# Patient Record
Sex: Female | Born: 1975 | Race: Black or African American | Hispanic: No | Marital: Single | Smoking: Never smoker
Health system: Southern US, Community
[De-identification: ages and names within clinical notes are randomized; demographics above are authoritative.]

## PROBLEM LIST (undated history)

## (undated) DIAGNOSIS — M329 Systemic lupus erythematosus, unspecified: Secondary | ICD-10-CM

## (undated) DIAGNOSIS — D219 Benign neoplasm of connective and other soft tissue, unspecified: Secondary | ICD-10-CM

## (undated) DIAGNOSIS — J40 Bronchitis, not specified as acute or chronic: Secondary | ICD-10-CM

## (undated) DIAGNOSIS — J45909 Unspecified asthma, uncomplicated: Secondary | ICD-10-CM

## (undated) DIAGNOSIS — F419 Anxiety disorder, unspecified: Secondary | ICD-10-CM

## (undated) DIAGNOSIS — K602 Anal fissure, unspecified: Secondary | ICD-10-CM

## (undated) DIAGNOSIS — N83209 Unspecified ovarian cyst, unspecified side: Secondary | ICD-10-CM

## (undated) DIAGNOSIS — I10 Essential (primary) hypertension: Secondary | ICD-10-CM

## (undated) DIAGNOSIS — R51 Headache: Secondary | ICD-10-CM

## (undated) DIAGNOSIS — G8929 Other chronic pain: Secondary | ICD-10-CM

## (undated) DIAGNOSIS — E669 Obesity, unspecified: Secondary | ICD-10-CM

## (undated) DIAGNOSIS — IMO0002 Reserved for concepts with insufficient information to code with codable children: Secondary | ICD-10-CM

## (undated) DIAGNOSIS — E119 Type 2 diabetes mellitus without complications: Secondary | ICD-10-CM

## (undated) DIAGNOSIS — T4145XA Adverse effect of unspecified anesthetic, initial encounter: Secondary | ICD-10-CM

## (undated) DIAGNOSIS — M549 Dorsalgia, unspecified: Secondary | ICD-10-CM

## (undated) DIAGNOSIS — M542 Cervicalgia: Secondary | ICD-10-CM

## (undated) DIAGNOSIS — R519 Headache, unspecified: Secondary | ICD-10-CM

## (undated) DIAGNOSIS — T8859XA Other complications of anesthesia, initial encounter: Secondary | ICD-10-CM

## (undated) HISTORY — DX: Anxiety disorder, unspecified: F41.9

## (undated) HISTORY — DX: Headache: R51

## (undated) HISTORY — PX: APPENDECTOMY: SHX54

## (undated) HISTORY — DX: Anal fissure, unspecified: K60.2

## (undated) HISTORY — PX: WISDOM TOOTH EXTRACTION: SHX21

## (undated) HISTORY — PX: OVARIAN CYST REMOVAL: SHX89

## (undated) HISTORY — DX: Bronchitis, not specified as acute or chronic: J40

## (undated) HISTORY — DX: Dorsalgia, unspecified: M54.9

## (undated) HISTORY — DX: Systemic lupus erythematosus, unspecified: M32.9

## (undated) HISTORY — PX: BREAST LUMPECTOMY: SHX2

## (undated) HISTORY — PX: DILATION AND CURETTAGE OF UTERUS: SHX78

## (undated) HISTORY — DX: Obesity, unspecified: E66.9

## (undated) HISTORY — DX: Reserved for concepts with insufficient information to code with codable children: IMO0002

## (undated) HISTORY — DX: Cervicalgia: M54.2

## (undated) HISTORY — DX: Headache, unspecified: R51.9

## (undated) HISTORY — DX: Other chronic pain: G89.29

## (undated) HISTORY — DX: Type 2 diabetes mellitus without complications: E11.9

---

## 1998-08-06 ENCOUNTER — Emergency Department (HOSPITAL_COMMUNITY): Admission: EM | Admit: 1998-08-06 | Discharge: 1998-08-06 | Payer: Self-pay | Admitting: Emergency Medicine

## 1998-08-08 ENCOUNTER — Emergency Department (HOSPITAL_COMMUNITY): Admission: EM | Admit: 1998-08-08 | Discharge: 1998-08-09 | Payer: Self-pay | Admitting: Emergency Medicine

## 1998-08-10 ENCOUNTER — Ambulatory Visit (HOSPITAL_COMMUNITY): Admission: RE | Admit: 1998-08-10 | Discharge: 1998-08-10 | Payer: Self-pay | Admitting: Family Medicine

## 1998-08-10 ENCOUNTER — Encounter: Payer: Self-pay | Admitting: Family Medicine

## 1998-09-18 ENCOUNTER — Emergency Department (HOSPITAL_COMMUNITY): Admission: EM | Admit: 1998-09-18 | Discharge: 1998-09-18 | Payer: Self-pay | Admitting: Emergency Medicine

## 1998-10-17 ENCOUNTER — Emergency Department (HOSPITAL_COMMUNITY): Admission: EM | Admit: 1998-10-17 | Discharge: 1998-10-17 | Payer: Self-pay | Admitting: *Deleted

## 1998-10-31 ENCOUNTER — Emergency Department (HOSPITAL_COMMUNITY): Admission: EM | Admit: 1998-10-31 | Discharge: 1998-10-31 | Payer: Self-pay | Admitting: Emergency Medicine

## 1998-11-03 ENCOUNTER — Emergency Department (HOSPITAL_COMMUNITY): Admission: EM | Admit: 1998-11-03 | Discharge: 1998-11-03 | Payer: Self-pay | Admitting: Emergency Medicine

## 1998-11-11 ENCOUNTER — Emergency Department (HOSPITAL_COMMUNITY): Admission: EM | Admit: 1998-11-11 | Discharge: 1998-11-11 | Payer: Self-pay | Admitting: Emergency Medicine

## 1998-11-11 ENCOUNTER — Encounter: Payer: Self-pay | Admitting: Emergency Medicine

## 1998-11-27 ENCOUNTER — Emergency Department (HOSPITAL_COMMUNITY): Admission: EM | Admit: 1998-11-27 | Discharge: 1998-11-27 | Payer: Self-pay | Admitting: Emergency Medicine

## 1998-11-30 ENCOUNTER — Emergency Department (HOSPITAL_COMMUNITY): Admission: EM | Admit: 1998-11-30 | Discharge: 1998-11-30 | Payer: Self-pay | Admitting: Emergency Medicine

## 1998-12-01 ENCOUNTER — Ambulatory Visit (HOSPITAL_COMMUNITY): Admission: RE | Admit: 1998-12-01 | Discharge: 1998-12-01 | Payer: Self-pay | Admitting: Emergency Medicine

## 1998-12-01 ENCOUNTER — Encounter: Payer: Self-pay | Admitting: Emergency Medicine

## 1998-12-02 ENCOUNTER — Emergency Department (HOSPITAL_COMMUNITY): Admission: EM | Admit: 1998-12-02 | Discharge: 1998-12-03 | Payer: Self-pay | Admitting: Emergency Medicine

## 1998-12-02 ENCOUNTER — Emergency Department (HOSPITAL_COMMUNITY): Admission: EM | Admit: 1998-12-02 | Discharge: 1998-12-02 | Payer: Self-pay | Admitting: Internal Medicine

## 1998-12-10 ENCOUNTER — Inpatient Hospital Stay (HOSPITAL_COMMUNITY): Admission: AD | Admit: 1998-12-10 | Discharge: 1998-12-10 | Payer: Self-pay | Admitting: Family Medicine

## 1998-12-12 ENCOUNTER — Inpatient Hospital Stay (HOSPITAL_COMMUNITY): Admission: AD | Admit: 1998-12-12 | Discharge: 1998-12-12 | Payer: Self-pay | Admitting: *Deleted

## 1998-12-14 ENCOUNTER — Inpatient Hospital Stay (HOSPITAL_COMMUNITY): Admission: AD | Admit: 1998-12-14 | Discharge: 1998-12-14 | Payer: Self-pay | Admitting: *Deleted

## 1998-12-18 ENCOUNTER — Inpatient Hospital Stay (HOSPITAL_COMMUNITY): Admission: AD | Admit: 1998-12-18 | Discharge: 1998-12-18 | Payer: Self-pay | Admitting: Obstetrics

## 1998-12-25 ENCOUNTER — Inpatient Hospital Stay (HOSPITAL_COMMUNITY): Admission: AD | Admit: 1998-12-25 | Discharge: 1998-12-25 | Payer: Self-pay | Admitting: *Deleted

## 1998-12-31 ENCOUNTER — Emergency Department (HOSPITAL_COMMUNITY): Admission: EM | Admit: 1998-12-31 | Discharge: 1998-12-31 | Payer: Self-pay | Admitting: Emergency Medicine

## 1999-01-01 ENCOUNTER — Encounter: Payer: Self-pay | Admitting: Emergency Medicine

## 1999-01-13 ENCOUNTER — Emergency Department (HOSPITAL_COMMUNITY): Admission: EM | Admit: 1999-01-13 | Discharge: 1999-01-13 | Payer: Self-pay | Admitting: Emergency Medicine

## 1999-02-07 ENCOUNTER — Inpatient Hospital Stay (HOSPITAL_COMMUNITY): Admission: AD | Admit: 1999-02-07 | Discharge: 1999-02-07 | Payer: Self-pay | Admitting: Obstetrics & Gynecology

## 1999-02-09 ENCOUNTER — Emergency Department (HOSPITAL_COMMUNITY): Admission: EM | Admit: 1999-02-09 | Discharge: 1999-02-09 | Payer: Self-pay | Admitting: Emergency Medicine

## 1999-02-11 ENCOUNTER — Encounter: Payer: Self-pay | Admitting: Emergency Medicine

## 1999-02-11 ENCOUNTER — Ambulatory Visit (HOSPITAL_COMMUNITY): Admission: RE | Admit: 1999-02-11 | Discharge: 1999-02-11 | Payer: Self-pay | Admitting: Emergency Medicine

## 1999-02-16 ENCOUNTER — Inpatient Hospital Stay (HOSPITAL_COMMUNITY): Admission: AD | Admit: 1999-02-16 | Discharge: 1999-02-16 | Payer: Self-pay | Admitting: *Deleted

## 1999-02-19 ENCOUNTER — Inpatient Hospital Stay (HOSPITAL_COMMUNITY): Admission: AD | Admit: 1999-02-19 | Discharge: 1999-02-19 | Payer: Self-pay | Admitting: Obstetrics

## 1999-02-23 ENCOUNTER — Encounter: Payer: Self-pay | Admitting: Emergency Medicine

## 1999-02-23 ENCOUNTER — Emergency Department (HOSPITAL_COMMUNITY): Admission: EM | Admit: 1999-02-23 | Discharge: 1999-02-24 | Payer: Self-pay | Admitting: Emergency Medicine

## 1999-03-27 ENCOUNTER — Inpatient Hospital Stay (HOSPITAL_COMMUNITY): Admission: AD | Admit: 1999-03-27 | Discharge: 1999-03-27 | Payer: Self-pay | Admitting: *Deleted

## 1999-04-09 ENCOUNTER — Emergency Department (HOSPITAL_COMMUNITY): Admission: EM | Admit: 1999-04-09 | Discharge: 1999-04-09 | Payer: Self-pay | Admitting: Emergency Medicine

## 1999-04-15 ENCOUNTER — Inpatient Hospital Stay (HOSPITAL_COMMUNITY): Admission: AD | Admit: 1999-04-15 | Discharge: 1999-04-15 | Payer: Self-pay | Admitting: Obstetrics & Gynecology

## 1999-04-20 ENCOUNTER — Emergency Department (HOSPITAL_COMMUNITY): Admission: EM | Admit: 1999-04-20 | Discharge: 1999-04-20 | Payer: Self-pay | Admitting: Emergency Medicine

## 1999-04-20 ENCOUNTER — Encounter: Payer: Self-pay | Admitting: *Deleted

## 1999-04-22 ENCOUNTER — Emergency Department (HOSPITAL_COMMUNITY): Admission: EM | Admit: 1999-04-22 | Discharge: 1999-04-22 | Payer: Self-pay | Admitting: Emergency Medicine

## 1999-04-25 ENCOUNTER — Emergency Department (HOSPITAL_COMMUNITY): Admission: EM | Admit: 1999-04-25 | Discharge: 1999-04-26 | Payer: Self-pay | Admitting: Emergency Medicine

## 1999-05-13 ENCOUNTER — Encounter: Admission: RE | Admit: 1999-05-13 | Discharge: 1999-05-13 | Payer: Self-pay | Admitting: Obstetrics

## 1999-06-09 ENCOUNTER — Emergency Department (HOSPITAL_COMMUNITY): Admission: EM | Admit: 1999-06-09 | Discharge: 1999-06-09 | Payer: Self-pay | Admitting: Emergency Medicine

## 1999-06-09 ENCOUNTER — Encounter: Payer: Self-pay | Admitting: Emergency Medicine

## 1999-11-12 ENCOUNTER — Inpatient Hospital Stay (HOSPITAL_COMMUNITY): Admission: AD | Admit: 1999-11-12 | Discharge: 1999-11-12 | Payer: Self-pay | Admitting: Obstetrics & Gynecology

## 1999-11-16 ENCOUNTER — Inpatient Hospital Stay (HOSPITAL_COMMUNITY): Admission: EM | Admit: 1999-11-16 | Discharge: 1999-11-16 | Payer: Self-pay | Admitting: *Deleted

## 2000-02-10 ENCOUNTER — Emergency Department (HOSPITAL_COMMUNITY): Admission: EM | Admit: 2000-02-10 | Discharge: 2000-02-10 | Payer: Self-pay | Admitting: *Deleted

## 2000-03-14 ENCOUNTER — Inpatient Hospital Stay (HOSPITAL_COMMUNITY): Admission: AD | Admit: 2000-03-14 | Discharge: 2000-03-14 | Payer: Self-pay | Admitting: Obstetrics

## 2000-04-28 ENCOUNTER — Emergency Department (HOSPITAL_COMMUNITY): Admission: EM | Admit: 2000-04-28 | Discharge: 2000-04-28 | Payer: Self-pay | Admitting: Emergency Medicine

## 2000-07-02 ENCOUNTER — Inpatient Hospital Stay (HOSPITAL_COMMUNITY): Admission: AD | Admit: 2000-07-02 | Discharge: 2000-07-02 | Payer: Self-pay | Admitting: Obstetrics

## 2000-07-18 ENCOUNTER — Emergency Department (HOSPITAL_COMMUNITY): Admission: EM | Admit: 2000-07-18 | Discharge: 2000-07-18 | Payer: Self-pay | Admitting: Internal Medicine

## 2000-08-12 ENCOUNTER — Inpatient Hospital Stay (HOSPITAL_COMMUNITY): Admission: AD | Admit: 2000-08-12 | Discharge: 2000-08-12 | Payer: Self-pay | Admitting: *Deleted

## 2000-10-21 ENCOUNTER — Emergency Department (HOSPITAL_COMMUNITY): Admission: EM | Admit: 2000-10-21 | Discharge: 2000-10-21 | Payer: Self-pay | Admitting: Emergency Medicine

## 2000-11-02 ENCOUNTER — Inpatient Hospital Stay (HOSPITAL_COMMUNITY): Admission: AD | Admit: 2000-11-02 | Discharge: 2000-11-02 | Payer: Self-pay | Admitting: Obstetrics & Gynecology

## 2000-11-20 ENCOUNTER — Emergency Department (HOSPITAL_COMMUNITY): Admission: EM | Admit: 2000-11-20 | Discharge: 2000-11-20 | Payer: Self-pay | Admitting: Emergency Medicine

## 2001-04-08 ENCOUNTER — Inpatient Hospital Stay (HOSPITAL_COMMUNITY): Admission: AD | Admit: 2001-04-08 | Discharge: 2001-04-08 | Payer: Self-pay | Admitting: Obstetrics & Gynecology

## 2001-07-12 ENCOUNTER — Inpatient Hospital Stay (HOSPITAL_COMMUNITY): Admission: AD | Admit: 2001-07-12 | Discharge: 2001-07-12 | Payer: Self-pay | Admitting: Obstetrics & Gynecology

## 2001-09-06 ENCOUNTER — Encounter: Admission: RE | Admit: 2001-09-06 | Discharge: 2001-09-06 | Payer: Self-pay | Admitting: Obstetrics

## 2001-09-25 ENCOUNTER — Ambulatory Visit (HOSPITAL_COMMUNITY): Admission: RE | Admit: 2001-09-25 | Discharge: 2001-09-25 | Payer: Self-pay | Admitting: Internal Medicine

## 2002-07-12 ENCOUNTER — Emergency Department (HOSPITAL_COMMUNITY): Admission: EM | Admit: 2002-07-12 | Discharge: 2002-07-12 | Payer: Self-pay | Admitting: Emergency Medicine

## 2002-09-15 ENCOUNTER — Emergency Department (HOSPITAL_COMMUNITY): Admission: EM | Admit: 2002-09-15 | Discharge: 2002-09-15 | Payer: Self-pay | Admitting: Emergency Medicine

## 2003-01-23 ENCOUNTER — Encounter: Payer: Self-pay | Admitting: Emergency Medicine

## 2003-01-23 ENCOUNTER — Emergency Department (HOSPITAL_COMMUNITY): Admission: EM | Admit: 2003-01-23 | Discharge: 2003-01-23 | Payer: Self-pay | Admitting: Emergency Medicine

## 2003-08-16 ENCOUNTER — Emergency Department (HOSPITAL_COMMUNITY): Admission: EM | Admit: 2003-08-16 | Discharge: 2003-08-16 | Payer: Self-pay | Admitting: Emergency Medicine

## 2004-01-07 ENCOUNTER — Inpatient Hospital Stay (HOSPITAL_COMMUNITY): Admission: AD | Admit: 2004-01-07 | Discharge: 2004-01-08 | Payer: Self-pay | Admitting: Obstetrics and Gynecology

## 2004-03-29 ENCOUNTER — Emergency Department (HOSPITAL_COMMUNITY): Admission: EM | Admit: 2004-03-29 | Discharge: 2004-03-29 | Payer: Self-pay | Admitting: Emergency Medicine

## 2004-06-28 ENCOUNTER — Emergency Department (HOSPITAL_COMMUNITY): Admission: EM | Admit: 2004-06-28 | Discharge: 2004-06-28 | Payer: Self-pay | Admitting: Emergency Medicine

## 2004-08-11 ENCOUNTER — Emergency Department (HOSPITAL_COMMUNITY): Admission: EM | Admit: 2004-08-11 | Discharge: 2004-08-11 | Payer: Self-pay | Admitting: Emergency Medicine

## 2004-11-25 ENCOUNTER — Emergency Department (HOSPITAL_COMMUNITY): Admission: EM | Admit: 2004-11-25 | Discharge: 2004-11-25 | Payer: Self-pay | Admitting: Emergency Medicine

## 2004-12-05 ENCOUNTER — Inpatient Hospital Stay (HOSPITAL_COMMUNITY): Admission: AD | Admit: 2004-12-05 | Discharge: 2004-12-05 | Payer: Self-pay | Admitting: *Deleted

## 2004-12-13 ENCOUNTER — Inpatient Hospital Stay (HOSPITAL_COMMUNITY): Admission: RE | Admit: 2004-12-13 | Discharge: 2004-12-13 | Payer: Self-pay | Admitting: *Deleted

## 2004-12-16 ENCOUNTER — Inpatient Hospital Stay (HOSPITAL_COMMUNITY): Admission: AD | Admit: 2004-12-16 | Discharge: 2004-12-16 | Payer: Self-pay | Admitting: *Deleted

## 2004-12-29 ENCOUNTER — Emergency Department (HOSPITAL_COMMUNITY): Admission: EM | Admit: 2004-12-29 | Discharge: 2004-12-29 | Payer: Self-pay | Admitting: Emergency Medicine

## 2005-01-18 ENCOUNTER — Inpatient Hospital Stay (HOSPITAL_COMMUNITY): Admission: AD | Admit: 2005-01-18 | Discharge: 2005-01-18 | Payer: Self-pay | Admitting: Obstetrics & Gynecology

## 2005-01-28 ENCOUNTER — Inpatient Hospital Stay (HOSPITAL_COMMUNITY): Admission: AD | Admit: 2005-01-28 | Discharge: 2005-01-28 | Payer: Self-pay | Admitting: Obstetrics & Gynecology

## 2005-02-05 ENCOUNTER — Observation Stay (HOSPITAL_COMMUNITY): Admission: AD | Admit: 2005-02-05 | Discharge: 2005-02-05 | Payer: Self-pay | Admitting: Obstetrics and Gynecology

## 2005-02-09 ENCOUNTER — Ambulatory Visit (HOSPITAL_COMMUNITY): Admission: RE | Admit: 2005-02-09 | Discharge: 2005-02-09 | Payer: Self-pay | Admitting: Obstetrics and Gynecology

## 2005-02-23 ENCOUNTER — Inpatient Hospital Stay (HOSPITAL_COMMUNITY): Admission: AD | Admit: 2005-02-23 | Discharge: 2005-02-23 | Payer: Self-pay | Admitting: Obstetrics and Gynecology

## 2005-04-23 ENCOUNTER — Emergency Department (HOSPITAL_COMMUNITY): Admission: EM | Admit: 2005-04-23 | Discharge: 2005-04-23 | Payer: Self-pay | Admitting: Emergency Medicine

## 2005-05-11 ENCOUNTER — Inpatient Hospital Stay (HOSPITAL_COMMUNITY): Admission: AD | Admit: 2005-05-11 | Discharge: 2005-05-11 | Payer: Self-pay | Admitting: Obstetrics & Gynecology

## 2005-06-04 ENCOUNTER — Inpatient Hospital Stay (HOSPITAL_COMMUNITY): Admission: AD | Admit: 2005-06-04 | Discharge: 2005-06-04 | Payer: Self-pay | Admitting: Obstetrics and Gynecology

## 2005-07-10 ENCOUNTER — Inpatient Hospital Stay (HOSPITAL_COMMUNITY): Admission: AD | Admit: 2005-07-10 | Discharge: 2005-07-10 | Payer: Self-pay | Admitting: Obstetrics and Gynecology

## 2005-07-17 ENCOUNTER — Inpatient Hospital Stay (HOSPITAL_COMMUNITY): Admission: AD | Admit: 2005-07-17 | Discharge: 2005-07-18 | Payer: Self-pay | Admitting: Obstetrics & Gynecology

## 2005-07-19 ENCOUNTER — Inpatient Hospital Stay (HOSPITAL_COMMUNITY): Admission: AD | Admit: 2005-07-19 | Discharge: 2005-07-19 | Payer: Self-pay | Admitting: Obstetrics & Gynecology

## 2005-07-24 ENCOUNTER — Inpatient Hospital Stay (HOSPITAL_COMMUNITY): Admission: AD | Admit: 2005-07-24 | Discharge: 2005-07-24 | Payer: Self-pay | Admitting: Obstetrics and Gynecology

## 2005-07-25 ENCOUNTER — Inpatient Hospital Stay (HOSPITAL_COMMUNITY): Admission: AD | Admit: 2005-07-25 | Discharge: 2005-07-25 | Payer: Self-pay | Admitting: Obstetrics and Gynecology

## 2005-07-26 ENCOUNTER — Inpatient Hospital Stay (HOSPITAL_COMMUNITY): Admission: AD | Admit: 2005-07-26 | Discharge: 2005-07-28 | Payer: Self-pay | Admitting: Obstetrics and Gynecology

## 2005-11-20 ENCOUNTER — Emergency Department (HOSPITAL_COMMUNITY): Admission: EM | Admit: 2005-11-20 | Discharge: 2005-11-20 | Payer: Self-pay | Admitting: Emergency Medicine

## 2006-01-15 ENCOUNTER — Emergency Department (HOSPITAL_COMMUNITY): Admission: EM | Admit: 2006-01-15 | Discharge: 2006-01-15 | Payer: Self-pay | Admitting: *Deleted

## 2006-02-12 ENCOUNTER — Emergency Department (HOSPITAL_COMMUNITY): Admission: EM | Admit: 2006-02-12 | Discharge: 2006-02-12 | Payer: Self-pay | Admitting: Emergency Medicine

## 2006-08-08 ENCOUNTER — Emergency Department (HOSPITAL_COMMUNITY): Admission: EM | Admit: 2006-08-08 | Discharge: 2006-08-09 | Payer: Self-pay | Admitting: Emergency Medicine

## 2006-09-08 ENCOUNTER — Emergency Department (HOSPITAL_COMMUNITY): Admission: EM | Admit: 2006-09-08 | Discharge: 2006-09-08 | Payer: Self-pay | Admitting: Emergency Medicine

## 2006-10-09 ENCOUNTER — Ambulatory Visit (HOSPITAL_COMMUNITY): Admission: EM | Admit: 2006-10-09 | Discharge: 2006-10-10 | Payer: Self-pay | Admitting: Emergency Medicine

## 2006-10-10 ENCOUNTER — Encounter (INDEPENDENT_AMBULATORY_CARE_PROVIDER_SITE_OTHER): Payer: Self-pay | Admitting: *Deleted

## 2006-12-13 ENCOUNTER — Encounter (INDEPENDENT_AMBULATORY_CARE_PROVIDER_SITE_OTHER): Payer: Self-pay | Admitting: *Deleted

## 2006-12-13 ENCOUNTER — Ambulatory Visit (HOSPITAL_COMMUNITY): Admission: RE | Admit: 2006-12-13 | Discharge: 2006-12-13 | Payer: Self-pay | Admitting: Obstetrics and Gynecology

## 2007-07-13 ENCOUNTER — Emergency Department (HOSPITAL_COMMUNITY): Admission: EM | Admit: 2007-07-13 | Discharge: 2007-07-13 | Payer: Self-pay | Admitting: Emergency Medicine

## 2007-08-05 ENCOUNTER — Inpatient Hospital Stay (HOSPITAL_COMMUNITY): Admission: AD | Admit: 2007-08-05 | Discharge: 2007-08-05 | Payer: Self-pay | Admitting: Obstetrics & Gynecology

## 2008-04-21 ENCOUNTER — Emergency Department (HOSPITAL_COMMUNITY): Admission: EM | Admit: 2008-04-21 | Discharge: 2008-04-22 | Payer: Self-pay | Admitting: Emergency Medicine

## 2008-07-01 ENCOUNTER — Emergency Department (HOSPITAL_COMMUNITY): Admission: EM | Admit: 2008-07-01 | Discharge: 2008-07-01 | Payer: Self-pay | Admitting: Family Medicine

## 2008-07-25 ENCOUNTER — Emergency Department (HOSPITAL_COMMUNITY): Admission: EM | Admit: 2008-07-25 | Discharge: 2008-07-25 | Payer: Self-pay | Admitting: Emergency Medicine

## 2008-08-25 ENCOUNTER — Inpatient Hospital Stay (HOSPITAL_COMMUNITY): Admission: AD | Admit: 2008-08-25 | Discharge: 2008-08-25 | Payer: Self-pay | Admitting: Obstetrics & Gynecology

## 2008-09-08 ENCOUNTER — Emergency Department (HOSPITAL_COMMUNITY): Admission: EM | Admit: 2008-09-08 | Discharge: 2008-09-08 | Payer: Self-pay | Admitting: Emergency Medicine

## 2008-09-18 ENCOUNTER — Encounter: Admission: RE | Admit: 2008-09-18 | Discharge: 2008-09-18 | Payer: Self-pay | Admitting: Otolaryngology

## 2008-12-22 ENCOUNTER — Encounter: Admission: RE | Admit: 2008-12-22 | Discharge: 2008-12-22 | Payer: Self-pay | Admitting: Specialist

## 2009-01-14 ENCOUNTER — Emergency Department (HOSPITAL_COMMUNITY): Admission: EM | Admit: 2009-01-14 | Discharge: 2009-01-14 | Payer: Self-pay | Admitting: Emergency Medicine

## 2009-06-07 ENCOUNTER — Inpatient Hospital Stay (HOSPITAL_COMMUNITY): Admission: AD | Admit: 2009-06-07 | Discharge: 2009-06-07 | Payer: Self-pay | Admitting: Obstetrics and Gynecology

## 2009-06-07 ENCOUNTER — Ambulatory Visit: Payer: Self-pay | Admitting: Family

## 2009-06-29 ENCOUNTER — Emergency Department (HOSPITAL_BASED_OUTPATIENT_CLINIC_OR_DEPARTMENT_OTHER): Admission: EM | Admit: 2009-06-29 | Discharge: 2009-06-29 | Payer: Self-pay | Admitting: Emergency Medicine

## 2010-02-16 DIAGNOSIS — I10 Essential (primary) hypertension: Secondary | ICD-10-CM | POA: Insufficient documentation

## 2010-07-20 IMAGING — CT CT HEAD W/O CM
2 series · 15 of 30 positions shown, 19 images · non-contrast
Comparison: NONE

CLINICAL DATA: Severe headaches for one month.  Nausea and 
vomiting.  Family history of aneurysm. 

CT HEAD WITHOUT INTRAVENOUS CONTRAST
TECHNIQUE: Axial 5 mm thick slices were obtained through the 
posterior fossa and 5 mm thick slices were obtained through the 
remaining portion of the head without intravenous contrast.

[Series 2: without contrast · axial · non-contrast · 0.49mm/px · z∈[-506,-362]mm · 13 of 34 slices shown, 17 images]
[im 3/34  brain]
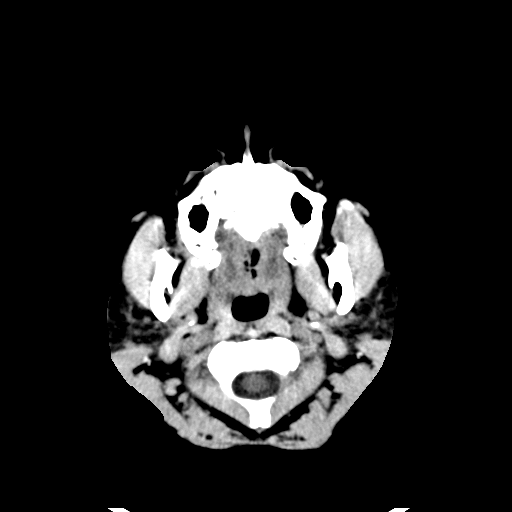
[im 3/34  bone]
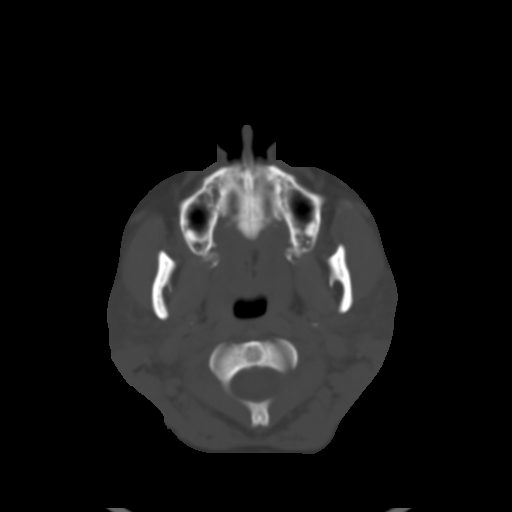
[im 5/34  brain]
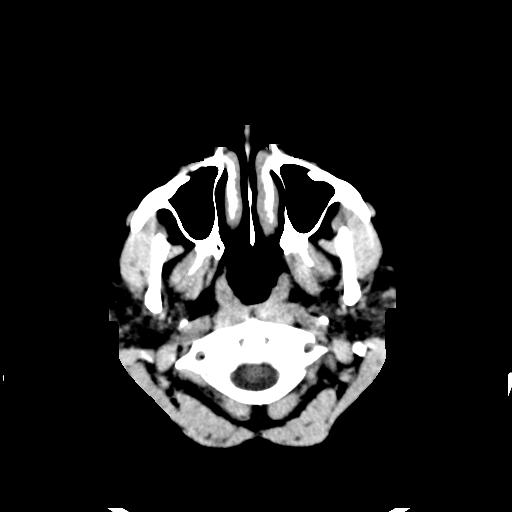
[im 8/34  brain]
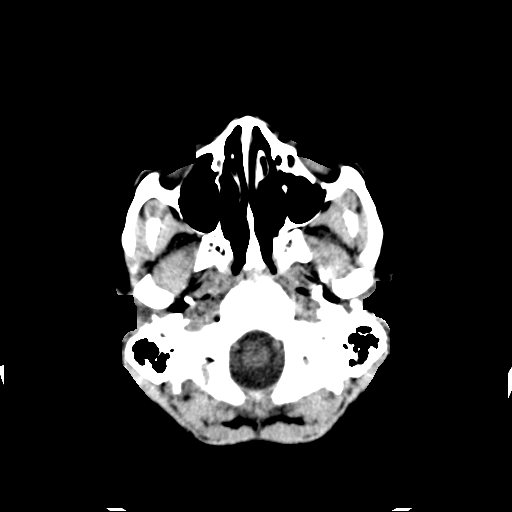
[im 10/34  brain]
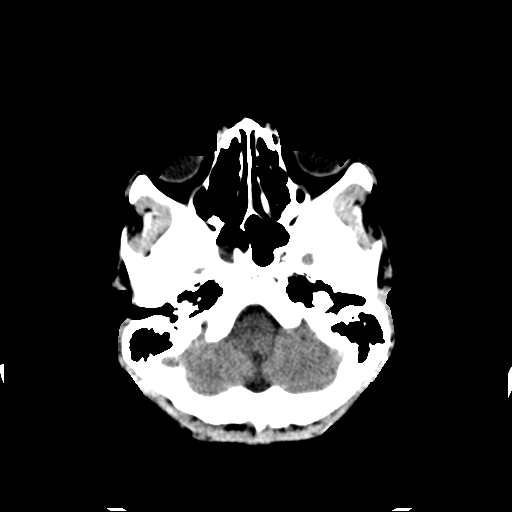
[im 12/34  brain]
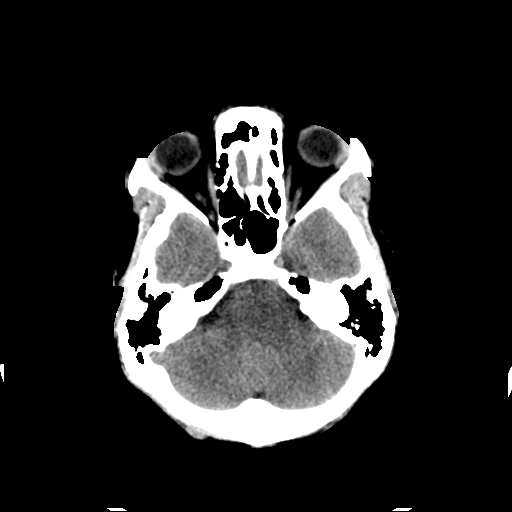
[im 12/34  bone]
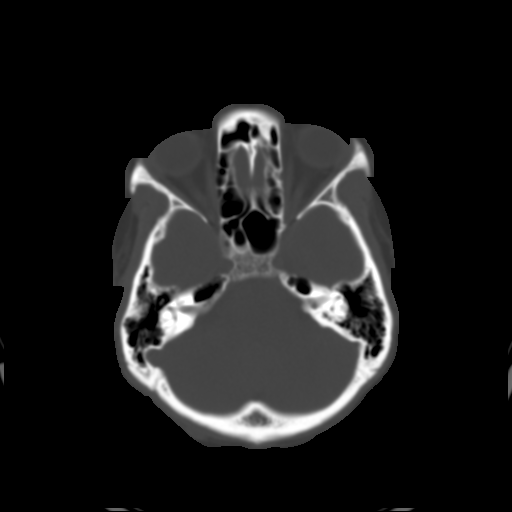
[im 15/34  brain]
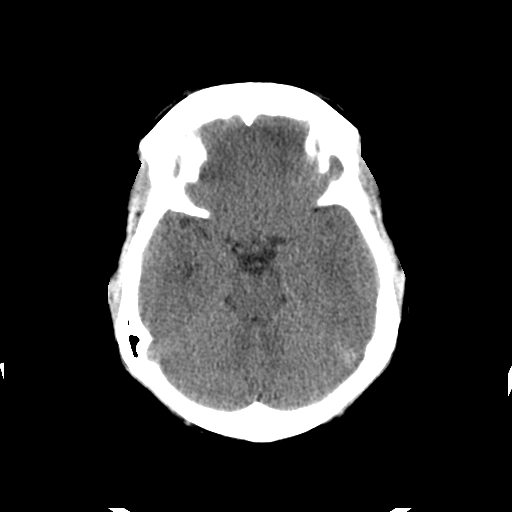
[im 17/34  brain]
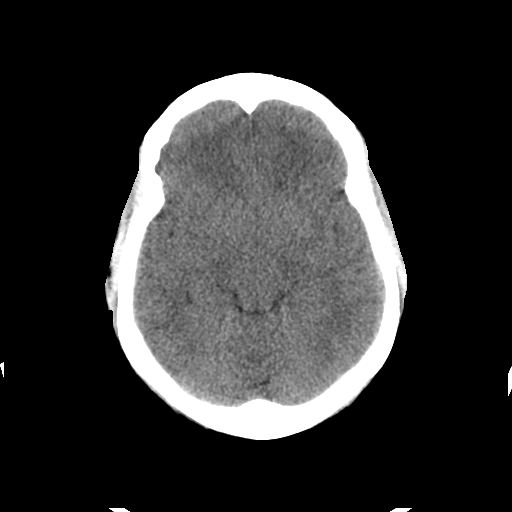
[im 19/34  brain]
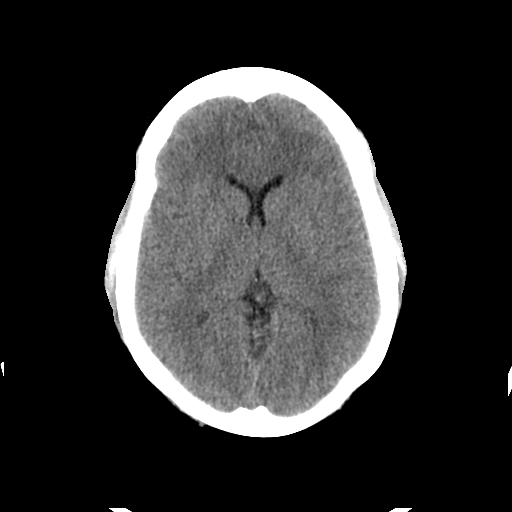
[im 22/34  brain]
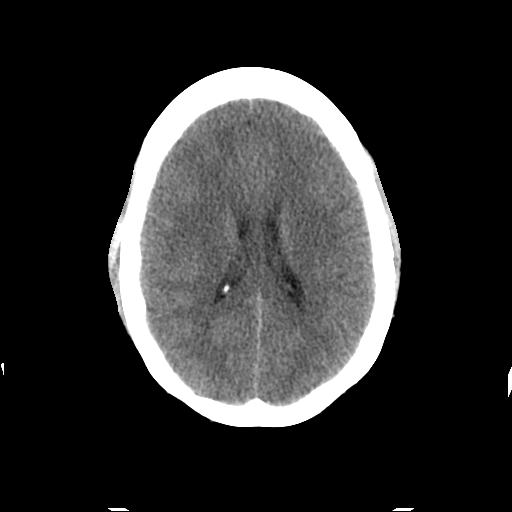
[im 22/34  bone]
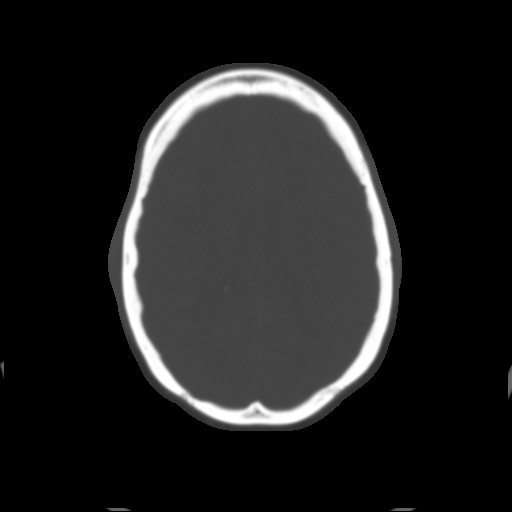
[im 24/34  brain]
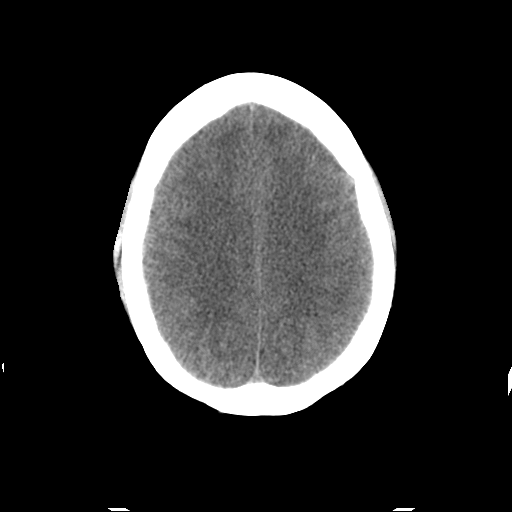
[im 26/34  brain]
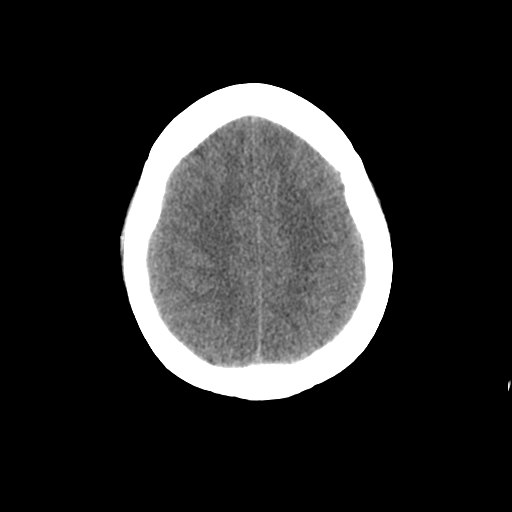
[im 29/34  brain]
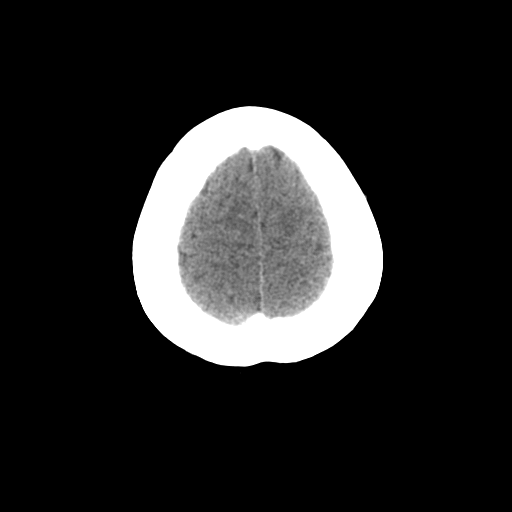
[im 31/34  brain]
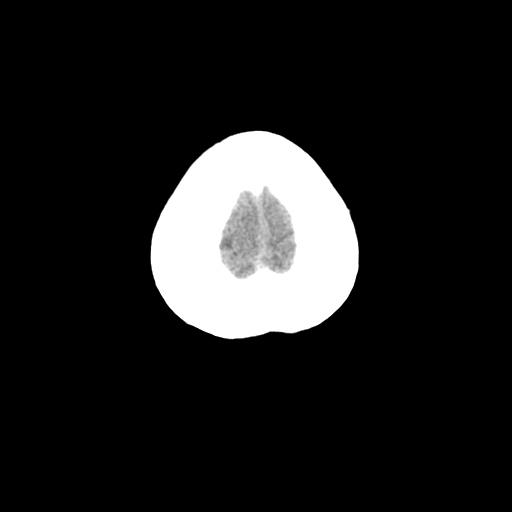
[im 31/34  bone]
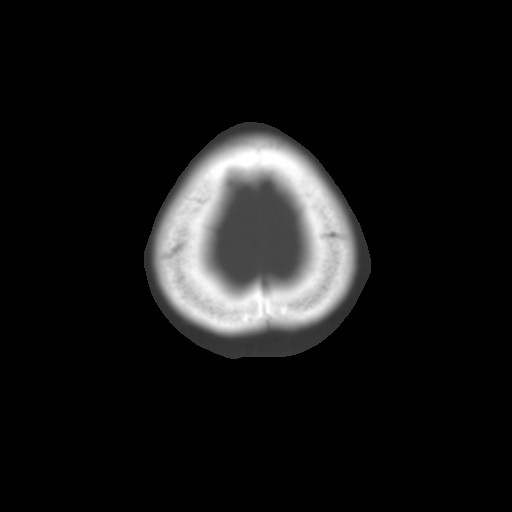

[Series 3: bone windows · axial · 0.49mm/px · z∈[-506,-480]mm · 2 of 34 slices shown]
[im 3/34  bone]
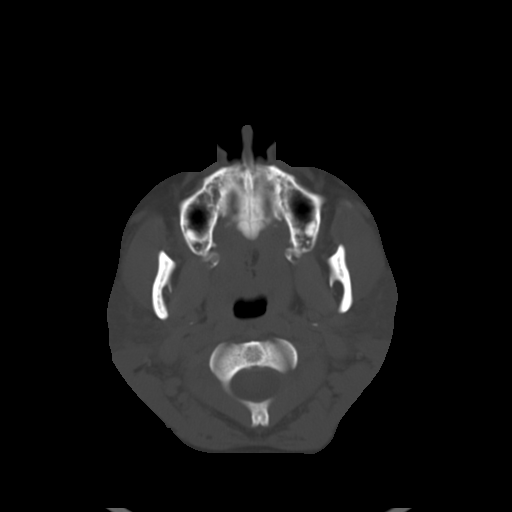
[im 8/34  bone]
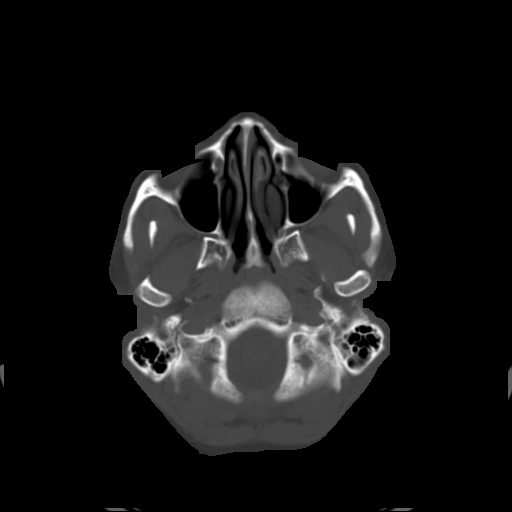

[15 of 30 positions shown; findings below may reference images not displayed]

FINDINGS: Right sphenoid sinus retention cyst or polyp measuring 
1.5 cm in size.  Extracranial structures are otherwise 
unremarkable. The fourth, third and both lateral ventricles are 
identified.  There is no evidence of midline shift or mass effect 
infratentorially or supratentorially. No areas of abnormal 
radiolucency or radiodensity are identified. There is no evidence 
of subarachnoid or intracerebral hemorrhage.  There is no evidence 
of subdural or epidural hematoma. The calvarium is intact.
IMPRESSION: Sphenoid sinus retention cyst or polyp.  No 
intracranial abnormality.  If symptoms persist, recommend MRA of 
the brain with attention to the circle of Willis. Knopke, Mantencion Date:  07/31/2008 TAWNYA BUNN

## 2010-09-18 ENCOUNTER — Encounter: Payer: Self-pay | Admitting: Obstetrics & Gynecology

## 2010-09-19 ENCOUNTER — Encounter: Payer: Self-pay | Admitting: Otolaryngology

## 2010-09-19 ENCOUNTER — Encounter: Payer: Self-pay | Admitting: Specialist

## 2010-12-01 LAB — URINALYSIS, ROUTINE W REFLEX MICROSCOPIC
Bilirubin Urine: NEGATIVE
Glucose, UA: NEGATIVE mg/dL
Ketones, ur: NEGATIVE mg/dL
Nitrite: NEGATIVE
Protein, ur: NEGATIVE mg/dL
Specific Gravity, Urine: 1.023 (ref 1.005–1.030)
Urobilinogen, UA: 0.2 mg/dL (ref 0.0–1.0)
pH: 5.5 (ref 5.0–8.0)

## 2010-12-01 LAB — URINE CULTURE

## 2010-12-01 LAB — URINE MICROSCOPIC-ADD ON

## 2010-12-01 LAB — PREGNANCY, URINE: Preg Test, Ur: NEGATIVE

## 2010-12-03 LAB — URINE CULTURE
Colony Count: NO GROWTH
Culture: NO GROWTH

## 2010-12-03 LAB — URINALYSIS, ROUTINE W REFLEX MICROSCOPIC
Bilirubin Urine: NEGATIVE
Glucose, UA: NEGATIVE mg/dL
Hgb urine dipstick: NEGATIVE
Specific Gravity, Urine: 1.01 (ref 1.005–1.030)
Urobilinogen, UA: 0.2 mg/dL (ref 0.0–1.0)
pH: 5.5 (ref 5.0–8.0)

## 2010-12-03 LAB — GC/CHLAMYDIA PROBE AMP, GENITAL
Chlamydia, DNA Probe: NEGATIVE
GC Probe Amp, Genital: NEGATIVE

## 2010-12-03 LAB — WET PREP, GENITAL

## 2010-12-03 LAB — URINE MICROSCOPIC-ADD ON

## 2010-12-07 LAB — RAPID STREP SCREEN (MED CTR MEBANE ONLY): Streptococcus, Group A Screen (Direct): NEGATIVE

## 2010-12-13 LAB — DIFFERENTIAL
Basophils Absolute: 0 10*3/uL (ref 0.0–0.1)
Basophils Relative: 0 % (ref 0–1)
Eosinophils Absolute: 0 10*3/uL (ref 0.0–0.7)
Eosinophils Relative: 1 % (ref 0–5)
Lymphocytes Relative: 16 % (ref 12–46)
Lymphs Abs: 1.4 10*3/uL (ref 0.7–4.0)
Monocytes Absolute: 0.5 10*3/uL (ref 0.1–1.0)
Monocytes Relative: 6 % (ref 3–12)
Neutro Abs: 6.7 10*3/uL (ref 1.7–7.7)
Neutrophils Relative %: 77 % (ref 43–77)

## 2010-12-13 LAB — COMPREHENSIVE METABOLIC PANEL
ALT: 22 U/L (ref 0–35)
AST: 23 U/L (ref 0–37)
Albumin: 4 g/dL (ref 3.5–5.2)
Alkaline Phosphatase: 74 U/L (ref 39–117)
BUN: 11 mg/dL (ref 6–23)
CO2: 25 mEq/L (ref 19–32)
Calcium: 9.5 mg/dL (ref 8.4–10.5)
Chloride: 102 mEq/L (ref 96–112)
Creatinine, Ser: 0.99 mg/dL (ref 0.4–1.2)
GFR calc Af Amer: >60 mL/min (ref >60)
GFR calc non Af Amer: >60 mL/min (ref >60)
Glucose, Bld: 101 mg/dL — ABNORMAL HIGH (ref 70–99)
Potassium: 2.8 mEq/L — ABNORMAL LOW (ref 3.5–5.1)
Sodium: 139 mEq/L (ref 135–145)
Total Bilirubin: 0.2 mg/dL — ABNORMAL LOW (ref 0.3–1.2)
Total Protein: 8 g/dL (ref 6.0–8.3)

## 2010-12-13 LAB — URINE MICROSCOPIC-ADD ON

## 2010-12-13 LAB — CBC
HCT: 38.4 % (ref 36.0–46.0)
Hemoglobin: 12.4 g/dL (ref 12.0–15.0)
MCHC: 32.4 g/dL (ref 30.0–36.0)
MCV: 89 fL (ref 78.0–100.0)
Platelets: 340 10*3/uL (ref 150–400)
RBC: 4.31 MIL/uL (ref 3.87–5.11)
RDW: 15.8 % — ABNORMAL HIGH (ref 11.5–15.5)
WBC: 8.7 10*3/uL (ref 4.0–10.5)

## 2010-12-13 LAB — URINALYSIS, ROUTINE W REFLEX MICROSCOPIC
Bilirubin Urine: NEGATIVE
Glucose, UA: NEGATIVE mg/dL
Hgb urine dipstick: NEGATIVE
Ketones, ur: 15 mg/dL — AB
Nitrite: NEGATIVE
Protein, ur: 30 mg/dL — AB
Specific Gravity, Urine: 1.027 (ref 1.005–1.030)
Urobilinogen, UA: 1 mg/dL (ref 0.0–1.0)
pH: 6 (ref 5.0–8.0)

## 2010-12-13 LAB — URINE CULTURE

## 2010-12-13 LAB — PREGNANCY, URINE: Preg Test, Ur: NEGATIVE

## 2010-12-22 ENCOUNTER — Ambulatory Visit: Payer: Self-pay | Admitting: *Deleted

## 2010-12-29 ENCOUNTER — Ambulatory Visit: Payer: Self-pay | Admitting: *Deleted

## 2011-01-06 ENCOUNTER — Encounter: Payer: Medicaid Other | Admitting: *Deleted

## 2011-01-06 ENCOUNTER — Encounter: Payer: Medicaid Other | Attending: Family Medicine | Admitting: *Deleted

## 2011-01-06 DIAGNOSIS — E669 Obesity, unspecified: Secondary | ICD-10-CM | POA: Insufficient documentation

## 2011-01-06 DIAGNOSIS — Z713 Dietary counseling and surveillance: Secondary | ICD-10-CM | POA: Insufficient documentation

## 2011-01-06 DIAGNOSIS — R7309 Other abnormal glucose: Secondary | ICD-10-CM | POA: Insufficient documentation

## 2011-01-14 NOTE — Discharge Summary (Signed)
NAMEJIM, PHILEMON            ACCOUNT NO.:  [id number]   MEDICAL RECORD NO.:  [id number]          PATIENT TYPE:  INP   LOCATION:  9133                          FACILITY:  [facility]   PHYSICIAN:  Gerrit Friends. Aldona Bar, M.D.   DATE OF BIRTH:  Jul 28, 1976   DATE OF ADMISSION:  07/26/2005  DATE OF DISCHARGE:  07/28/2005                                 DISCHARGE SUMMARY   DISCHARGE DIAGNOSIS:  1.  Term pregnancy delivered 6 pounds 11 ounces female infant, Apgars 09/09.  2.  Blood type A positive.  3.  Equivocal rubella titer.   PROCEDURES:  1.  Normal spontaneous delivery.  2.  Repair of second-degree tear.   SUMMARY:  This 35 year old gravida 2, para 0 was admitted at term in labor  with possible ruptured membranes. She was begun on Pitocin augmentation and  progressed. She subsequently had a normal spontaneous delivery of a 6 pound  11 ounce female infant with Apgars of 09/09 over second-degree tear which  was repaired without difficulty. Her postpartum course was benign. Discharge  hemoglobin 11.3, white count of 14,100, platelet count 246,000. On the  morning of 07/28/05 she was ambulating well, tolerating a regular diet well,  was attempting to breast feed, was due to receive rubella vaccine prior to  discharge and as well discuss her breast-feeding with lactation consultant.  She was given all appropriate instructions on the morning of 07/28/05 and  understood all instructions well.   DISCHARGE MEDICATIONS:  1.  Vitamins - one a day  2.  Iron medications as she was on antenatally - 1 a day  3.  She was given prescription for Tylox to use 1-2 every 4-6 hours as      needed for severe pain.  4.  She will use Tylenol for less severe pain.   FOLLOW UP:  She will be returning the office follow-up in approximately four  weeks' time or as needed.   CONDITION ON DISCHARGE:  Improved.      Gerrit Friends. Aldona Bar, M.D.  Electronically Signed     RMW/MEDQ  D:  07/28/2005  T:  07/28/2005  Job:   [id number]

## 2011-01-14 NOTE — Op Note (Signed)
NAMEEVELY, GAINEY NO.:  [id number]   MEDICAL RECORD NO.:  [id number]          PATIENT TYPE:  INP   LOCATION:  0098                         FACILITY:  [facility]   PHYSICIAN:  Sharlet Salina T. Hoxworth, M.D.DATE OF BIRTH:  02-19-1976   DATE OF PROCEDURE:  10/10/2006  DATE OF DISCHARGE:                               OPERATIVE REPORT   PRE AND POSTOPERATIVE DIAGNOSIS:  Acute appendicitis.   SURGICAL PROCEDURES:  Laparoscopic appendectomy.   BRIEF HISTORY:  Graceann Boileau is a 35 year old black female who  presents with typical history and physical findings for acute  appendicitis which has been confirmed by CT scan.  Laparoscopic  appendectomy has been recommended and accepted.  The next of the  procedure, indications, risks of bleeding and infection have been  discussed and understood preoperatively.  She is now brought to the  operating room for this procedure.   DESCRIPTION OF OPERATION:  The patient brought to the operating room and  placed in supine position on the operating table and general  endotracheal anesthesia was induced.  The abdomen was widely sterilely  prepped and draped.  She received preoperative IV antibiotics.  Correct  patient and procedure were verified.  Access was obtained with an open  Hasson technique through a mattress sutures of 0 Vicryl via 1 cm  incision just above the umbilicus.  Pneumoperitoneum was established.  Under direct vision, a 5 mm trocar was placed in the right upper  quadrant and a 12 mm trocar in the left lower quadrant.  The appendix  was visualized and was acutely inflamed.  There was no gangrene or  perforation.  There was a known good size cyst on the right ovary.  This  was photographed.  The appendix was very mobile and was elevated.  The  base was not inflamed.  The mesoappendix was sequentially divided with  Harmonic scalpel until the appendix was completely freed down to its  base and it was then divided with a  single firing of the Endo GIA 45 mm  stapler.  The appendix was placed in EndoCatch bag and brought out  through the umbilical incision.  The right lower quadrant was thoroughly  irrigated and complete hemostasis was assured.  Trocars removed and all  CO2 evacuated.  The mattress suture was secured at the umbilicus.  Skin  incisions were closed with interrupted subcuticular 4-0 Monocryl and  Dermabond.  Sponge, needle and instrument counts were correct.  The  patient taken recovery in good condition.      Lorne Skeens. Hoxworth, M.D.  Electronically Signed     BTH/MEDQ  D:  10/10/2006  T:  10/10/2006  Job:  [id number]

## 2011-01-14 NOTE — H&P (Signed)
[name], [name]            ACCOUNT NO.:  [id number]   MEDICAL RECORD NO.:  [id number]          PATIENT TYPE:  AMB   LOCATION:  SDC                           FACILITY:  [facility]   PHYSICIAN:  Hal Morales, M.D.DATE OF BIRTH:  February 23, 1976   DATE OF ADMISSION:  12/13/2006  DATE OF DISCHARGE:                              HISTORY & PHYSICAL   HISTORY OF PRESENT ILLNESS:  [name] is a 35 year old single African-  American female, para 1, 0-2-1, who presents for laparoscopic right  ovarian cystectomy because of a persistent right dermoid cyst and pelvic  pain.  The patient was seen on December 7th at [facility] emergency department after experiencing one week of right lower  quadrant pain.  A pelvic ultrasound at that time demonstrated a complex  right ovarian cyst measuring 3.9 cm.  The patient's CBC, comprehensive  metabolic panel, and urinalysis were within normal limits, and her urine  pregnancy test was negative.  The patient was therefore discharged home  on pain medication.  The patient remained asymptomatic until February,  2008 when she developed severe right lower quadrant pelvic pain  accompanied by nausea and vomiting.  A CT scan of the abdomen and pelvis  with contrast indicated appendicitis along with a right ovarian lesion,  which measured 3.9 x 4.3 cm with fat and low attenuation material,  consistent with a dermoid.  Patient subsequently underwent an  appendectomy and was told to follow up with her gynecologist for her  ovarian cyst.  The patient's tests for gonorrhea, Chlamydia, HIV,  hepatitis B, and syphilis were all negative.  Shortly after recovering  from her appendicitis episode, the patient again developed pelvic pain.  She described the intermittent daily 5-10 minute pain as throbbing in  nature.  Additionally, the patient found very little relief from taking  over-the-counter analgesia and found that her discomfort was best  relieved by being still.  She denies any urinary tract symptoms,  vaginitis symptoms, changes in her bowel habits, fever, or dyspareunia.  A pelvic ultrasound on November 06, 2006 showed a right dermoid cyst.  The  cystic portion measured 3.2 x 1.7 x 2.2 cm, calcified portion 2.2 x 1.6  x 2.1 cm, and a homogenous portion measuring 2.5 x 1.9 x 2.5 cm.  Given  her current symptoms and persistent nature of her ovarian cyst, the  patient wishes to proceed with surgical management in the form of a  laparoscopic ovarian cystectomy.   PAST OB HISTORY:  Gravida 2, para 1, 0-2-1.  Patient had a spontaneous  vaginal birth in 2006 at 39 weeks.  She had a heterotopic pregnancy  previously, which was managed with a D&C and some type of injection.   PAST GYN HISTORY:  Menarche, 35 years old.  Last menstrual period November 14, 2006.  Patient reports that her periods have always been  irregular.  She uses abstinence as a method of contraception.  She does  have a history of Chlamydia and an abnormal Pap smear.  Her last normal  Pap smear was on October 30, 2006.   PAST  MEDICAL HISTORY:  1. Hypertension.  2. Anemia.  3. Gastroesophageal reflux disease.  4. Heart murmur.  5. Sickle cell trait.   PAST SURGICAL HISTORY:  1. In 2000, D&C due to a spontaneous abortion.  2. In 2008, appendectomy.  She denies any problems with anesthesia or      history of blood transfusion.   FAMILY HISTORY:  Cardiovascular disease.  Tuberculosis.  Migraines.  Anemia.  Diabetes.  Breast cancer in third decade (mother).  Hypertension.   HABITS:  No alcohol or tobacco.   SOCIAL HISTORY:  The patient is single and unemployed currently.   CURRENT MEDICATIONS:  1. Benadryl 25 mg 2 tablets as needed.  2. Fexofenadine 60 mg daily as needed.  3. Benicar 20 mg daily as needed.  4. EpiPen as needed.   ALLERGIES:  TORADOL, SEAFOOD, both of which causes anaphylaxis (patient  carries an EpiPen with her).  Pt has used Betadine on  numerous occasions  without difficulty, and had IV contrast for her CT scan without  difficulty.   REVIEW OF SYSTEMS:  Patient does wear glasses.  She has pain in her left  upper jaw molar (this is intermittent).  She denies any fever, nausea,  vomiting, diarrhea, chest pain, shortness of breath, headache, visual  changes, and except as mentioned in the history of present illness,  the  patient's review of systems is negative.   PHYSICAL EXAMINATION:  VITAL SIGNS:  Blood pressure 140/82.  Weight is  253.  Height is 5 feet, 7 inches tall.  NECK:  Neck is supple.  There are no masses, thyromegaly, or cervical  adenopathy.  HEART:  Regular rate and rhythm.  LUNGS:  Clear.  BACK:  No CVA tenderness.  ABDOMEN:  Bowel sounds are present. There is lower quadrant tenderness  without guarding or rebound.  There are no palpable masses or  organomegaly.  EXTREMITIES:  No clubbing, cyanosis or edema.  PELVIC:  EG/BUS within normal limits.  Vagina is rugous.  Cervix is  nontender without lesions.  Uterus appears normal in size, shape, and  consistency without tenderness.  Right adnexa is tender with a palpable  mass, approximately 3-4 cm.  Left adnexa without tenderness or masses.   IMPRESSION:  1. Right dermoid cyst.  2. Pelvic pain.   DISPOSITION:  A discussion was held with the patient regarding the  indications for her procedure, along with its risks, which include but  are not limited to, reaction to anesthesia, damage to adjacent organs,  infection, and  excessive bleeding.  Patient has consented to proceed with a  laparoscopic right ovarian cystectomy at [facility] [place]  on December 13, 2006 at 8:30 a.m.  She understands that an open laparotomy  may be needed to remove the ovarian cyst.      [name].      Hal Morales, M.D.  Electronically Signed   EJP/MEDQ  D:  11/30/2006  T:  11/30/2006  Job:  [id number]

## 2011-01-14 NOTE — H&P (Signed)
[name], [name] NO.:  [id number]   MEDICAL RECORD NO.:  [id number]          PATIENT TYPE:  INP   LOCATION:  0098                         FACILITY:  [facility]   PHYSICIAN:  Sharlet Salina T. Hoxworth, M.D.DATE OF BIRTH:  1976-03-17   DATE OF ADMISSION:  10/10/2006  DATE OF DISCHARGE:                              HISTORY & PHYSICAL   CHIEF COMPLAINT:  Abdominal pain.   HISTORY OF PRESENT ILLNESS:  [name] is a 35 year old black  female who presents with a 12-hour history of steadily increasing  constant right lower quadrant abdominal pain.  This has become severe.  She has been nauseated and vomited a couple of times.  She has felt  feverish.  She denies any history of similar previous complaints or any  chronic GI problems.  She has had some right lower quadrant pain from an  ovarian cyst in the past, but this was different in character.  She has  no urinary symptoms.   PAST MEDICAL HISTORY:  Surgery significant only for D&C.  Medically, she  is treated for hypertension.   MEDICATIONS:  Benicar 1 daily.   ALLERGIES:  TORADOL WHICH CAUSES ANAPHYLACTIC REACTION.   SOCIAL HISTORY:  The patient is single.  No cigarettes, alcohol.  Unemployed.   FAMILY HISTORY:  Mother alive and well, noncontributory.   REVIEW OF SYSTEMS:  GENERAL:  Positive for fever with this illness.  HEENT:  No vision, hearing, swallowing problems.  RESPIRATORY:  No shortness breath, cough, wheezing.  CARDIAC:  No chest pains.  She has a history of heart murmur  asymptomatic.  ABDOMEN/GI/GU:  As above.   PHYSICAL EXAM:  VITAL SIGNS:  Temperature is 101; pulse is 100,  respirations 16; blood pressure is 142/91.  GENERAL:  She is an obese black female in no acute distress.  SKIN:  Warm, dry.  No rash or infection.  HEENT:  No palpable mass or thyromegaly.  Sclerae nonicteric.  LUNGS:  Clear without wheezing or increased work of breathing.  CARDIAC:  Regular rate and rhythm.  No  murmurs appreciable on my exam.  Peripheral pulses intact.  No edema.  ABDOMEN:  There is marked right lower quadrant tenderness with guarding.  No discernible masses or organomegaly.  EXTREMITIES:  No joint swelling or deformity.  NEUROLOGIC:  Alert, oriented.  Motor and sensory exams grossly normal.   LABORATORY:  Urinalysis negative except for 3-6 white cells.  Pregnancy  urine negative.  Electrolytes are normal except potassium reported at  7.5, but the specimen showed marked hemolysis.  CBC shows a white count  of 16,000, hemoglobin 12.4.   CT scan of the abdomen and pelvis obtained from the emergency room.  There is a dermoid cyst in the right adnexa.  There is evidence of acute  appendicitis without perforation or abscess.   ASSESSMENT/PLAN:  Acute abdominal pain consistent with appendicitis as  confirmed by CT scan.  The patient is receiving preoperative broad-  spectrum IV antibiotics.  She will be taken to the operating room for  emergency laparoscopic appendectomy.      Lorne Skeens. Hoxworth, M.D.  Electronically Signed  BTH/MEDQ  D:  10/10/2006  T:  10/10/2006  Job:  [id number]

## 2011-01-14 NOTE — Consult Note (Signed)
[name], [name]            ACCOUNT NO.:  [id number]   MEDICAL RECORD NO.:  [id number]          PATIENT TYPE:  AMB   LOCATION:  SDC                           FACILITY:  [facility]   PHYSICIAN:  Hal Morales, M.D.DATE OF BIRTH:  1975/12/04   DATE OF CONSULTATION:  12/13/2006  DATE OF DISCHARGE:                                 CONSULTATION   PREOPERATIVE DIAGNOSES:  Right dermoid cyst, pelvic pain.   POSTOPERATIVE DIAGNOSES:  Right dermoid cyst, pelvic pain,  endometriosis.   PROCEDURE:  Laparoscopic right ovarian cystectomy.   SURGEON:  Dr. Dierdre Forth.   FIRST ASSISTANT:  Henreitta Leber, certified physician assistant.   ANESTHESIA:  General endotracheal.   ESTIMATED BLOOD LOSS:  150 mL.   COMPLICATIONS:  None.   FINDINGS:  The uterus, left tube and ovary and right tube appeared  normal.  The right ovary was enlarged with a 6 cm dermoid cyst and a 2  cm dermoid cyst as well as a 3 cm corpus luteum cyst.  There was no  torsion to the cyst.  There were likewise powder burn type lesions in  the right posterior cul-de-sac just lateral to the uterosacral ligament  and on the left uterosacral ligament consistent with endometriosis.   PROCEDURE:  The patient was taken to the operating room after  appropriate identification and placed on the operating table.  After the  attainment of adequate general anesthesia the patient was placed in the  modified lithotomy position.  The abdomen, perineum and vagina were  prepped with multiple layers of Betadine.  An Acorn cannula was placed  in the cervix with a single-tooth tenaculum on the anterior lip of the  cervix.  A Foley catheter was inserted and connected to straight  drainage.  The abdomen was draped as a sterile field.   Suprapubic and subumbilical injections of 4% Marcaine for a total of 10  mL were undertaken.  A suprapubic incision was made and a Verre's  cannula placed through that incision into the peritoneal  cavity.  Pneumoperitoneum was created with 3-1/2 liters of CO2 and the Verre's  cannula removed.  The laparoscopic trocar was placed through the  umbilical incision and the laparoscopic placed through the trocar  sleeve.  Suprapubic incisions were made to the right and left of the  midline and a laparoscopic trocar of 5 mm placed through the left  incision and a laparoscopic trocar of 10 mm placed through the right  suprapubic incision.   The above-noted findings were made and documented.  The right ovary was  elevated and the ovarian cortex incised with the use of a Harmonic Ace.  A combination of sharp dissection and hydrodissection allowed the cortex  to be completely incised and then peeled off the underlying ovarian  cyst.  Once the cortex had been completely peeled off the cyst with the  combination of hydrodissection and sharp dissection the larger cyst was  placed in an Endobag and removed through the right suprapubic incision  once it had been slightly enlarged to allow removal of the ovarian cyst.   The second dermoid was then  identified and the ovarian cortex connecting  it was incised and that cyst removed sharply and placed in an Endobag  for removal through the right suprapubic incision.  Copious irrigation  was carried out and hemostasis noted to be adequate.  Approximately 100  mL of warm lactated Ringer's was left in the pelvis.  The instruments  were removed from the peritoneal cavity under direct visualization as  the CO2 was allowed to escape.  The subumbilical and right suprapubic  incisions were closed first with fascial closure of figure-of-eight  suture of 0 Vicryl then subcuticular sutures of 3-0 Monocryl.  The left  suprapubic incision was closed with a subcuticular suture of 3-0  Monocryl.  Steri-Strips were applied and the tenaculum and cannula  removed.  The Foley catheter was likewise removed.  The patient was  awakened from general anesthesia and taken to  the recovery room in  satisfactory condition having tolerated the procedure well with sponge  and instrument counts correct.   SPECIMENS TO PATHOLOGY:  Two right ovarian dermoid cysts.      Hal Morales, M.D.  Electronically Signed     VPH/MEDQ  D:  12/13/2006  T:  12/13/2006  Job:  [id number]

## 2011-02-14 ENCOUNTER — Ambulatory Visit: Payer: Medicaid Other | Admitting: *Deleted

## 2011-02-16 ENCOUNTER — Ambulatory Visit: Payer: Medicaid Other | Admitting: *Deleted

## 2011-04-14 ENCOUNTER — Emergency Department (HOSPITAL_BASED_OUTPATIENT_CLINIC_OR_DEPARTMENT_OTHER)
Admission: EM | Admit: 2011-04-14 | Discharge: 2011-04-15 | Disposition: A | Discharge status: Home / Self Care | Payer: Medicaid Other | Attending: Emergency Medicine | Admitting: Emergency Medicine

## 2011-04-14 ENCOUNTER — Encounter: Payer: Self-pay | Admitting: *Deleted

## 2011-04-14 DIAGNOSIS — K602 Anal fissure, unspecified: Secondary | ICD-10-CM | POA: Insufficient documentation

## 2011-04-14 DIAGNOSIS — I10 Essential (primary) hypertension: Secondary | ICD-10-CM | POA: Insufficient documentation

## 2011-04-14 DIAGNOSIS — K625 Hemorrhage of anus and rectum: Secondary | ICD-10-CM | POA: Insufficient documentation

## 2011-04-14 HISTORY — DX: Essential (primary) hypertension: I10

## 2011-04-14 LAB — CBC
HCT: 34.1 % — ABNORMAL LOW (ref 36.0–46.0)
Hemoglobin: 11.5 g/dL — ABNORMAL LOW (ref 12.0–15.0)
MCH: 28.7 pg (ref 26.0–34.0)
MCV: 85 fL (ref 78.0–100.0)
RBC: 4.01 MIL/uL (ref 3.87–5.11)

## 2011-04-14 NOTE — ED Notes (Signed)
Pt has hx of rectal fissures that have been addressed by a physician, but states she has not had as much bleeding the other times as now.

## 2011-04-14 NOTE — ED Notes (Signed)
Pt presents to ED today with BRBPR x1.  Pt states "it filed the toilet"  Pt has associated nausea and abd cramping

## 2011-04-15 ENCOUNTER — Encounter (HOSPITAL_BASED_OUTPATIENT_CLINIC_OR_DEPARTMENT_OTHER): Payer: Self-pay | Admitting: Emergency Medicine

## 2011-04-15 MED ORDER — SENNOSIDES-DOCUSATE SODIUM 8.6-50 MG PO TABS: 1.0000 | ORAL_TABLET | Freq: Two times a day (BID) | ORAL | Status: DC

## 2011-04-15 NOTE — ED Provider Notes (Signed)
History     CSN: [id number] Arrival date & time: 04/14/2011 10:45 PM  Chief Complaint  Patient presents with  . Rectal Bleeding   Patient is a 35 y.o. female presenting with hematochezia. The history is provided by the patient. No language interpreter was used.  Rectal Bleeding  The current episode started 2 days ago. The onset was sudden. The problem occurs frequently. The problem has been unchanged. The pain is mild. The stool is described as hard and mixed with blood. Prior successful therapies include diet changes, fiber and stool softeners. Associated symptoms include rectal pain. Pertinent negatives include no anorexia, no fever, no abdominal pain, no diarrhea, no hemorrhoids, no vaginal bleeding, no chest pain and no difficulty breathing. There were no sick contacts. Recently, medical care has been given by the PCP. Services received include medications given (Fiber).   Patient has history of having an anal fissure previously. Patient describes her pain as being mild. She has only noted blood mixed in her stool when she has a bowel movement. She said previously this result by phone. Patient does have long-standing history of constipation. She denies any chest pain, shortness of breath, or lightheadedness. Past Medical History  Diagnosis Date  . Hypertension     No past surgical history on file.  No family history on file.  History  Substance Use Topics  . Smoking status: Never Smoker   . Smokeless tobacco: Not on file  . Alcohol Use: Yes     occ    OB History    Grav Para Term Preterm Abortions TAB SAB Ect Mult Living                  Review of Systems  Constitutional: Negative.  Negative for fever.  HENT: Negative.   Eyes: Negative.   Respiratory: Negative.   Cardiovascular: Negative.  Negative for chest pain.  Gastrointestinal: Positive for blood in stool, hematochezia and rectal pain. Negative for abdominal pain, diarrhea, anorexia and hemorrhoids.  Genitourinary:  Negative.  Negative for vaginal bleeding.  Musculoskeletal: Negative.   Skin: Negative.   Neurological: Negative.   Hematological: Negative.   Psychiatric/Behavioral: Negative.   All other systems reviewed and are negative.    Physical Exam  BP 115/73  Pulse 66  Temp(Src) 98.2 F (36.8 C) (Oral)  Resp 18  SpO2 100%  LMP 03/30/2011  Physical Exam  Vitals reviewed. Constitutional: She is oriented to person, place, and time. She appears well-developed and well-nourished.  HENT:  Head: Normocephalic and atraumatic.  Eyes: Conjunctivae and EOM are normal. Pupils are equal, round, and reactive to light.  Neck: Normal range of motion.  Cardiovascular: Normal rate, regular rhythm, normal heart sounds and intact distal pulses.  Exam reveals no gallop and no friction rub.   No murmur heard. Pulmonary/Chest: Effort normal and breath sounds normal. No respiratory distress. She has no wheezes. She has no rales.  Abdominal: Soft. Bowel sounds are normal. She exhibits no distension. There is no tenderness. There is no rebound and no guarding.  Genitourinary: Rectal exam shows fissure.  Musculoskeletal: Normal range of motion. She exhibits no edema and no tenderness.  Neurological: She is alert and oriented to person, place, and time. No cranial nerve deficit. She exhibits normal muscle tone. Coordination normal.  Skin: Skin is warm and dry. No rash noted.  Psychiatric: She has a normal mood and affect.    ED Course  Procedures  MDM Patient was examined by myself. She did not have any  abnormal vital signs or symptoms suggesting anemia clinically. Though she mentioned to the nurse that she had some nausea and abdominal cramping she stated that this was normal for her when she has constipation. She has tried fiber but has not taken any stool softeners or laxatives. Patient did not have any blood seen on exam today. She did have discomfort on rectal exam. There are no obvious hemorrhoids noted.  Patient remained hemodynamically stable and was safe for discharge. She was written a prescription for 7 and Colace. She was also told to follow up with her regular doctor if her symptoms persist. Patient can return if she develops worsening bleeding, chest pain, shortness of breath, or lightheadedness.  Assessment: 35 year old female who presents today complaining of rectal bleeding related to anal fissure.  Plan: As per MDM previously. Patient discharged home in good condition.      Cyndra Numbers, MD 04/15/11 [phone]

## 2011-05-05 ENCOUNTER — Other Ambulatory Visit (HOSPITAL_COMMUNITY): Payer: Self-pay | Admitting: Obstetrics

## 2011-05-05 DIAGNOSIS — Z8742 Personal history of other diseases of the female genital tract: Secondary | ICD-10-CM

## 2011-05-05 DIAGNOSIS — R102 Pelvic and perineal pain: Secondary | ICD-10-CM

## 2011-05-09 ENCOUNTER — Ambulatory Visit (HOSPITAL_COMMUNITY): Payer: Medicaid Other

## 2011-05-10 ENCOUNTER — Ambulatory Visit (HOSPITAL_COMMUNITY)
Admission: RE | Admit: 2011-05-10 | Discharge: 2011-05-10 | Disposition: A | Discharge status: Home / Self Care | Payer: Medicaid Other | Source: Ambulatory Visit | Attending: Obstetrics | Admitting: Obstetrics

## 2011-05-10 DIAGNOSIS — Z8742 Personal history of other diseases of the female genital tract: Secondary | ICD-10-CM

## 2011-05-10 DIAGNOSIS — R102 Pelvic and perineal pain: Secondary | ICD-10-CM

## 2011-05-10 DIAGNOSIS — D279 Benign neoplasm of unspecified ovary: Secondary | ICD-10-CM | POA: Insufficient documentation

## 2011-05-10 DIAGNOSIS — D259 Leiomyoma of uterus, unspecified: Secondary | ICD-10-CM | POA: Insufficient documentation

## 2011-06-03 LAB — HERPES SIMPLEX VIRUS CULTURE

## 2011-06-03 LAB — GC/CHLAMYDIA PROBE AMP, GENITAL: GC Probe Amp, Genital: NEGATIVE

## 2011-06-03 LAB — WET PREP, GENITAL: Yeast Wet Prep HPF POC: NONE SEEN

## 2011-06-06 LAB — URINALYSIS, ROUTINE W REFLEX MICROSCOPIC
Glucose, UA: NEGATIVE
Protein, ur: NEGATIVE
Specific Gravity, Urine: >1.03 — ABNORMAL HIGH
pH: 5.5

## 2011-06-06 LAB — GC/CHLAMYDIA PROBE AMP, GENITAL: Chlamydia, DNA Probe: NEGATIVE

## 2011-06-06 LAB — POCT PREGNANCY, URINE: Preg Test, Ur: NEGATIVE

## 2011-06-06 LAB — WET PREP, GENITAL

## 2011-07-11 ENCOUNTER — Other Ambulatory Visit (HOSPITAL_COMMUNITY): Payer: Self-pay | Admitting: Obstetrics

## 2011-07-11 DIAGNOSIS — D369 Benign neoplasm, unspecified site: Secondary | ICD-10-CM

## 2011-07-11 DIAGNOSIS — D259 Leiomyoma of uterus, unspecified: Secondary | ICD-10-CM

## 2011-08-02 ENCOUNTER — Inpatient Hospital Stay (HOSPITAL_COMMUNITY): Admission: RE | Admit: 2011-08-02 | Payer: Medicaid Other | Source: Ambulatory Visit

## 2011-08-12 ENCOUNTER — Other Ambulatory Visit (HOSPITAL_COMMUNITY): Payer: Self-pay | Admitting: Obstetrics

## 2011-08-12 DIAGNOSIS — R102 Pelvic and perineal pain: Secondary | ICD-10-CM

## 2011-08-18 ENCOUNTER — Ambulatory Visit (HOSPITAL_COMMUNITY)
Admission: RE | Admit: 2011-08-18 | Discharge: 2011-08-18 | Payer: Medicaid Other | Source: Ambulatory Visit | Attending: Obstetrics | Admitting: Obstetrics

## 2011-08-26 ENCOUNTER — Ambulatory Visit (HOSPITAL_COMMUNITY): Payer: Medicaid Other

## 2011-11-09 ENCOUNTER — Other Ambulatory Visit: Payer: Self-pay

## 2011-11-09 ENCOUNTER — Encounter (HOSPITAL_COMMUNITY): Payer: Self-pay

## 2011-11-09 ENCOUNTER — Emergency Department (INDEPENDENT_AMBULATORY_CARE_PROVIDER_SITE_OTHER)
Admission: EM | Admit: 2011-11-09 | Discharge: 2011-11-09 | Disposition: A | Discharge status: Home / Self Care | Payer: Medicaid Other | Source: Home / Self Care | Attending: Emergency Medicine | Admitting: Emergency Medicine

## 2011-11-09 DIAGNOSIS — F419 Anxiety disorder, unspecified: Secondary | ICD-10-CM

## 2011-11-09 DIAGNOSIS — F411 Generalized anxiety disorder: Secondary | ICD-10-CM

## 2011-11-09 LAB — POCT URINALYSIS DIP (DEVICE)
Bilirubin Urine: NEGATIVE
Ketones, ur: NEGATIVE mg/dL
Leukocytes, UA: NEGATIVE
pH: 6.5 (ref 5.0–8.0)

## 2011-11-09 NOTE — Discharge Instructions (Signed)
Decrease your sugar and caffeine intake, and see if this decreases your symptoms. Try exercising which will help with your mood, decrease stress, and will help you get more regular sleep. Followup with behavioral health, counseling and possible ongoing management. Return to the ER if you get worse, if you start having thoughts of putting herself or other people, if you're having severe trouble functioning, or any other concerns.

## 2011-11-09 NOTE — ED Provider Notes (Signed)
History     CSN: [id number]  Arrival date & time 11/09/11  4098   First MD Initiated Contact with Patient 11/09/11 1021      Chief Complaint  Patient presents with  . Anxiety    (Consider location/radiation/quality/duration/timing/severity/associated sxs/prior treatment) HPI Comments: Patient reports intermittent episodes of "shakiness" lasting several minutes and then resolving. She states that these happen after ingesting large amounts of sugar and/or caffeine. States she gets sweaty, feels like her heart is "racing". No nausea, vomiting, presyncope, syncope. No chest pain, shortness of breath. No medication changes. Denies illicit drug use, over-the-counter stimulants. Patient states that she's been under a significant amount of stress over the past month, going to school full-time, and is a single parent. Patient reports not sleeping well, that she's waking up 3-4 times night to. and that she's eating a lot of fast food, and does not have time to "take care of herself". Patient reports a 30-40 pounds weight gain over the past 2 months. No suicidal /homicidal ideations, auditory/visual hallucinations.  Patient is a 36 y.o. female presenting with anxiety. The history is provided by the patient. No language interpreter was used.  Anxiety This is a chronic problem. The current episode started more than 1 week ago. The problem has not changed since onset.Pertinent negatives include no chest pain, no headaches and no shortness of breath. Exacerbated by: Caffeine, sugar, stress. The symptoms are relieved by nothing. She has tried nothing for the symptoms. The treatment provided no relief.    Past Medical History  Diagnosis Date  . Hypertension     History reviewed. No pertinent past surgical history.  History reviewed. No pertinent family history.  History  Substance Use Topics  . Smoking status: Never Smoker   . Smokeless tobacco: Not on file  . Alcohol Use: Yes     occ    OB  History    Grav Para Term Preterm Abortions TAB SAB Ect Mult Living                  Review of Systems  Constitutional: Positive for fatigue. Negative for fever.  Respiratory: Negative for shortness of breath.   Cardiovascular: Negative for chest pain.  Gastrointestinal: Negative for nausea and vomiting.  Neurological: Positive for tremors. Negative for headaches.  Psychiatric/Behavioral: Negative for suicidal ideas, hallucinations and dysphoric mood. The patient is nervous/anxious.     Allergies  Ciprofloxacin; Metronidazole; and Toradol  Home Medications   Current Outpatient Rx  Name Route Sig Dispense Refill  . ATENOLOL 25 MG PO TABS Oral Take 25 mg by mouth daily.     Marland Kitchen FERROUS SULFATE 325 (65 FE) MG PO TABS Oral Take 325 mg by mouth daily.      Marland Kitchen LISINOPRIL 10 MG PO TABS Oral Take 10 mg by mouth daily.     Marland Kitchen POTASSIUM GLUCONATE PO Oral Take 1 tablet by mouth daily.      Bernadette Hoit SODIUM 8.6-50 MG PO TABS Oral Take 1 tablet by mouth 2 (two) times daily. 60 tablet 0    BP 125/77  Pulse 58  Temp(Src) 98.7 F (37.1 C) (Oral)  Resp 17  SpO2 99%  LMP 11/07/2011  Physical Exam  Nursing note and vitals reviewed. Constitutional: She is oriented to person, place, and time. She appears well-developed and well-nourished.  HENT:  Head: Normocephalic and atraumatic.  Eyes: Conjunctivae and EOM are normal. Pupils are equal, round, and reactive to light.  Neck: Normal range of motion.  Cardiovascular:  Normal rate, regular rhythm, normal heart sounds and intact distal pulses.   Pulmonary/Chest: Effort normal and breath sounds normal.  Abdominal: Soft. Bowel sounds are normal. She exhibits no distension. There is no tenderness. There is no rebound and no guarding.  Musculoskeletal: Normal range of motion. She exhibits no edema and no tenderness.  Neurological: She is alert and oriented to person, place, and time.  Skin: Skin is warm and dry. No rash noted.    Psychiatric: She has a normal mood and affect. Her behavior is normal. Judgment and thought content normal.    ED Course  Procedures (including critical care time)  Labs Reviewed  POCT URINALYSIS DIP (DEVICE) - Abnormal; Notable for the following:    Hgb urine dipstick LARGE (*)    All other components within normal limits  GLUCOSE, CAPILLARY   No results found.   1. Anxiety     upreg (-)  Results for orders placed during the hospital encounter of 11/09/11  GLUCOSE, CAPILLARY      Component Value Range   Glucose-Capillary 76  70 - 99 (mg/dL)  POCT URINALYSIS DIP (DEVICE)      Component Value Range   Glucose, UA NEGATIVE  NEGATIVE (mg/dL)   Bilirubin Urine NEGATIVE  NEGATIVE    Ketones, ur NEGATIVE  NEGATIVE (mg/dL)   Specific Gravity, Urine 1.020  1.005 - 1.030    Hgb urine dipstick LARGE (*) NEGATIVE    pH 6.5  5.0 - 8.0    Protein, ur NEGATIVE  NEGATIVE (mg/dL)   Urobilinogen, UA 0.2  0.0 - 1.0 (mg/dL)   Nitrite NEGATIVE  NEGATIVE    Leukocytes, UA NEGATIVE  NEGATIVE     EKG: Sinus bradycardia, rate 59. Normal axis, normal intervals, no hypertrophy, no ST T-wave changes. No previous EKG for comparison  MDM   Udip noted. Patient currently on menses. Unable to obtain i-STAT despite multiple attempts. Patient refused further attempts. Patient is lucid, cooperative, appears slightly anxious, but no psychotic or major depressive features, and does not appear to be a harm to self or to other people. PH q. 9 questionnaire: 14, indicative of moderate depression, and GAD 7 score: 16, indicative of severe anxiety. Checking i-STAT, as patient on supplemental potassium, and EKG, as patient is on a beta blocker.  If these are negative, we'll have her followup with behavioral health. Had extensive discussion with patient that hersymptoms are most likely a combination of stress, poor diet, excessive sugar and caffeine intake. Also discussed she may have a some anxiety and situational  depression. He patient seems reliable, and has good followup. will not start her on any anxiolytics or antidepressants at this time.  Domenick Gong, MD 11/09/11 1259

## 2011-11-09 NOTE — ED Notes (Addendum)
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 Denies SOB, UA symptoms

## 2011-12-16 ENCOUNTER — Other Ambulatory Visit (HOSPITAL_COMMUNITY): Payer: Self-pay | Admitting: Obstetrics

## 2011-12-16 DIAGNOSIS — R102 Pelvic and perineal pain: Secondary | ICD-10-CM

## 2011-12-21 ENCOUNTER — Ambulatory Visit (HOSPITAL_COMMUNITY)
Admission: RE | Admit: 2011-12-21 | Discharge: 2011-12-21 | Disposition: A | Discharge status: Home / Self Care | Payer: Medicaid Other | Source: Ambulatory Visit | Attending: Obstetrics | Admitting: Obstetrics

## 2011-12-21 DIAGNOSIS — N949 Unspecified condition associated with female genital organs and menstrual cycle: Secondary | ICD-10-CM | POA: Insufficient documentation

## 2011-12-21 DIAGNOSIS — D259 Leiomyoma of uterus, unspecified: Secondary | ICD-10-CM | POA: Insufficient documentation

## 2011-12-21 DIAGNOSIS — R102 Pelvic and perineal pain: Secondary | ICD-10-CM

## 2012-02-17 ENCOUNTER — Emergency Department (HOSPITAL_BASED_OUTPATIENT_CLINIC_OR_DEPARTMENT_OTHER): Payer: Medicaid Other

## 2012-02-17 ENCOUNTER — Encounter (HOSPITAL_BASED_OUTPATIENT_CLINIC_OR_DEPARTMENT_OTHER): Payer: Self-pay | Admitting: *Deleted

## 2012-02-17 ENCOUNTER — Emergency Department (HOSPITAL_BASED_OUTPATIENT_CLINIC_OR_DEPARTMENT_OTHER)
Admission: EM | Admit: 2012-02-17 | Discharge: 2012-02-17 | Disposition: A | Discharge status: Home / Self Care | Payer: Medicaid Other | Attending: Emergency Medicine | Admitting: Emergency Medicine

## 2012-02-17 DIAGNOSIS — Z9089 Acquired absence of other organs: Secondary | ICD-10-CM | POA: Insufficient documentation

## 2012-02-17 DIAGNOSIS — K5900 Constipation, unspecified: Secondary | ICD-10-CM | POA: Insufficient documentation

## 2012-02-17 DIAGNOSIS — Z881 Allergy status to other antibiotic agents status: Secondary | ICD-10-CM | POA: Insufficient documentation

## 2012-02-17 DIAGNOSIS — I10 Essential (primary) hypertension: Secondary | ICD-10-CM | POA: Insufficient documentation

## 2012-02-17 MED ORDER — PEG 3350-KCL-NABCB-NACL-NASULF 236 G PO SOLR: 4.0000 L | Freq: Once | ORAL | Status: AC

## 2012-02-17 NOTE — Discharge Instructions (Signed)
Please read and follow all provided instructions.  Your diagnoses today include:  1. Constipation     Tests performed today include:  Vital signs. See below for your results today.   Medications prescribed:   Golytely - medication for constipation  Home care instructions:  Follow any educational materials contained in this packet.  Once you are having normal bowel movements, use miralax once a day and increase amount of fruits and vegetables to prevent constipation.   You may also use an over-the-counter stool softener such as colace for 2 weeks.   Follow-up instructions: Please follow-up with your primary care provider in the next 3 days for further evaluation of your symptoms. If you do not have a primary care doctor -- see below for referral information.   Return instructions:   Please return to the Emergency Department if you experience worsening symptoms.   Return with worsening constant pain or persistent vomiting.  Please return if you have any other emergent concerns.  Additional Information:  Your vital signs today were: BP 120/72  Pulse 68  Temp 98.1 F (36.7 C) (Oral)  Resp 20  SpO2 100%  LMP 02/04/2012 If your blood pressure (BP) was elevated above 135/85 this visit, please have this repeated by your doctor within one month. -------------- No Primary Care Doctor Call [facility]  [phone] Other agencies that provide inexpensive medical care    [facility]  [phone]    [facility]  [phone]    [facility]  [phone]    [facility]  [phone]    [facility]  [phone]    [facility]  [phone] -------------- RESOURCE GUIDE:  Dental Problems  Patients with Medicaid: [facility] [phone]                                            [phone] Phone:  [phone]                                                   Phone:   [phone]  If unable to pay or uninsured, contact:  [facility] or [facility] to become qualified for the adult dental clinic.  Chronic Pain Problems Contact [facility]  [phone] Patients need to be referred by their primary care doctor.  Insufficient Money for Medicine Contact [employer]:  call "211" or [facility] [phone].  Psychological Services [facility]  [phone] [facility]  [phone] [facility] Mental [facility]   [phone] (emergency services [phone])  Substance Abuse Resources [facility]  [phone] [facility] [phone] [facility] [phone] Floydene Flock [phone] Residential & Outpatient Substance Abuse Program  [phone]  Abuse/Neglect [facility] Child Abuse Hotline [phone] [facility] Child Abuse Hotline 226-732-4761 (After Hours)  Emergency Shelter [facility] [phone]  Maternity Homes [facility] [phone] [facility] [phone]  [facility]  [facility] [address] [place]                       [address]      [address]  [place]                                                [place]                            [place] Phone:  [phone]                                   Phone:  [phone]                 Phone:  [phone]  [facility] Phone:  [phone]  [place] Child Abuse Hotline [phone] (After Hours)    

## 2012-02-17 NOTE — ED Provider Notes (Signed)
History     CSN: [id number]  Arrival date & time 02/17/12  1409   First MD Initiated Contact with Patient 02/17/12 1434      Chief Complaint  Patient presents with  . Constipation    (Consider location/radiation/quality/duration/timing/severity/associated sxs/prior treatment) HPI Comments: Patient presents with complaint of generalized abdominal pain and constipation for the past 2 weeks. Patient states that typically she will have one bowel movement per day however over the past 2 weeks  she has had a total of 3 bowel movements. patient has had constipation in the past. She's been treating herself at home with one dose of MiraLAX and several enemas which were mildly helpful. This morning the patient had episodes of vomiting when trying to eat solid foods. She is able to drink fluids normally. She is still passing gas. No fever, back pain, urinary symptoms. Onset was gradual.    Patient is a 36 y.o. female presenting with constipation. The history is provided by the patient.  Constipation  The current episode started more than 1 week ago. The onset was gradual. The problem occurs continuously. The problem has been gradually worsening. The pain is mild. The stool is described as hard. Prior successful therapies include enemas and laxatives. Associated symptoms include abdominal pain, nausea and vomiting. Pertinent negatives include no fever, no diarrhea, no hematuria, no chest pain, no headaches, no coughing, no difficulty breathing and no rash.    Past Medical History  Diagnosis Date  . Hypertension     Past Surgical History  Procedure Date  . Appendectomy     No family history on file.  History  Substance Use Topics  . Smoking status: Never Smoker   . Smokeless tobacco: Not on file  . Alcohol Use: Yes     occ    OB History    Grav Para Term Preterm Abortions TAB SAB Ect Mult Living                  Review of Systems  Constitutional: Negative for fever.  HENT:  Negative for sore throat and rhinorrhea.   Eyes: Negative for redness.  Respiratory: Negative for cough.   Cardiovascular: Negative for chest pain.  Gastrointestinal: Positive for nausea, vomiting, abdominal pain and constipation. Negative for diarrhea.  Genitourinary: Negative for dysuria and hematuria.  Musculoskeletal: Negative for myalgias.  Skin: Negative for rash.  Neurological: Negative for headaches.    Allergies  Ciprofloxacin; Ketorolac tromethamine; and Metronidazole  Home Medications   Current Outpatient Rx  Name Route Sig Dispense Refill  . ATENOLOL 25 MG PO TABS Oral Take 25 mg by mouth daily.     Marland Kitchen FERROUS SULFATE 325 (65 FE) MG PO TABS Oral Take 325 mg by mouth daily.      Marland Kitchen LISINOPRIL 10 MG PO TABS Oral Take 10 mg by mouth daily.     Marland Kitchen POTASSIUM GLUCONATE PO Oral Take 1 tablet by mouth daily.      Bernadette Hoit SODIUM 8.6-50 MG PO TABS Oral Take 1 tablet by mouth 2 (two) times daily. 60 tablet 0    BP 120/72  Pulse 68  Temp 98.1 F (36.7 C) (Oral)  Resp 20  SpO2 100%  LMP 02/04/2012  Physical Exam  Nursing note and vitals reviewed. Constitutional: She appears well-developed and well-nourished.  HENT:  Head: Normocephalic and atraumatic.  Eyes: Conjunctivae are normal. Right eye exhibits no discharge. Left eye exhibits no discharge.  Neck: Normal range of motion. Neck supple.  Cardiovascular: Normal  rate, regular rhythm and normal heart sounds.   Pulmonary/Chest: Effort normal and breath sounds normal.  Abdominal: Soft. There is generalized tenderness (mild, slightly worse RUQ). There is no rigidity, no rebound, no guarding, no CVA tenderness, no tenderness at McBurney's point and negative Murphy's sign.  Neurological: She is alert.  Skin: Skin is warm and dry.  Psychiatric: She has a normal mood and affect.    ED Course  Procedures (including critical care time)  Labs Reviewed - No data to display Dg Abd 1 View  02/17/2012  *RADIOLOGY  REPORT*  Clinical Data: History of upper abdominal pain and nausea and vomiting for 2 weeks.  Evaluate for constipation.  ABDOMEN - 1 VIEW  Comparison: CT 10/10/2006.  Findings: Bowel gas pattern is within normal limits.  The fecal burden within the colon is moderate.  Small rounded calcific density projects just to the left of the L4 vertebral body.  This is probably too medial to be a ureteral calculus although ureteral calculus cannot be excluded.  Most likely is a small phlebolith. No skeletal lesions are evident.  IMPRESSION: Bowel gas pattern within normal limits.  Fecal burden within the colon is moderate.  Small calcific density to the left of L4 is seen. The medial position of the calcific density does not favor ureteral calculus but calculus cannot be excluded. The calcific density probably is a phlebolith.  Original Report Authenticated By: Crawford Givens, M.D.     1. Constipation     3:00 PM Patient seen and examined. Will get x-ray of abdomen.   Vital signs reviewed and are as follows: Filed Vitals:   02/17/12 1413  BP: 120/72  Pulse: 68  Temp: 98.1 F (36.7 C)  Resp: 20   4:28 PM D/w results and plan with Dr. Fonnie Jarvis. Patient informed of results. Patient refuses rectal exam at this time. Will give GoLYTELY. Urged patient to increase the amount of fruits and vegetables in her diet, use MiraLAX, and use Colace for 2 weeks to prevent constipation. Patient also encouraged to use enemas. Patient urged to return with worsening abdominal pain, persistent vomiting, or she has any other concerns. She verbalizes understanding and agrees with the plan.   MDM  No evidence of obstruction on x-ray.         Renne Crigler, [place] 02/17/12 1630

## 2012-02-17 NOTE — ED Notes (Signed)
States she has been constipated x 2 weeks.

## 2012-02-18 NOTE — ED Provider Notes (Signed)
Medical screening examination/treatment/procedure(s) were performed by non-physician practitioner and as supervising physician I was immediately available for consultation/collaboration.  Aaliah Jorgenson M Javionna Leder, MD 02/18/12 1442 

## 2012-04-07 ENCOUNTER — Emergency Department (HOSPITAL_BASED_OUTPATIENT_CLINIC_OR_DEPARTMENT_OTHER): Payer: Medicaid Other

## 2012-04-07 ENCOUNTER — Emergency Department (HOSPITAL_BASED_OUTPATIENT_CLINIC_OR_DEPARTMENT_OTHER)
Admission: EM | Admit: 2012-04-07 | Discharge: 2012-04-08 | Disposition: A | Discharge status: Home / Self Care | Payer: Medicaid Other | Attending: Emergency Medicine | Admitting: Emergency Medicine

## 2012-04-07 ENCOUNTER — Encounter (HOSPITAL_BASED_OUTPATIENT_CLINIC_OR_DEPARTMENT_OTHER): Payer: Self-pay | Admitting: *Deleted

## 2012-04-07 DIAGNOSIS — W108XXA Fall (on) (from) other stairs and steps, initial encounter: Secondary | ICD-10-CM | POA: Insufficient documentation

## 2012-04-07 DIAGNOSIS — IMO0002 Reserved for concepts with insufficient information to code with codable children: Secondary | ICD-10-CM | POA: Insufficient documentation

## 2012-04-07 DIAGNOSIS — M542 Cervicalgia: Secondary | ICD-10-CM | POA: Insufficient documentation

## 2012-04-07 DIAGNOSIS — I10 Essential (primary) hypertension: Secondary | ICD-10-CM | POA: Insufficient documentation

## 2012-04-07 DIAGNOSIS — S8391XA Sprain of unspecified site of right knee, initial encounter: Secondary | ICD-10-CM

## 2012-04-07 NOTE — ED Provider Notes (Signed)
History  This chart was scribed for Hanley Seamen, MD by Ladona Ridgel Day. This patient was seen in room MH05/MH05 and the patient's care was started at 2301.   CSN: [id number]  Arrival date & time 04/07/12  2301   First MD Initiated Contact with Patient 04/07/12 2334      Chief Complaint  Patient presents with  . Knee Injury    The history is provided by the patient. No language interpreter was used.   [name] is a 36 y.o. female who presents to the Emergency Department complaining of right knee injury after she fell down 15 stairs yesterday by missing a step. She states that her right knee has constantly been painful and is not difficult to walk. She denies any LOC and there was no blood loss. She states she also has a sore neck but did not hit her head at all. She states when she stands up she can hear crackling in her right knee and it is painful. She denies any other injuries or illnesses at this time.   Past Medical History  Diagnosis Date  . Hypertension     Past Surgical History  Procedure Date  . Appendectomy     History reviewed. No pertinent family history.  History  Substance Use Topics  . Smoking status: Never Smoker   . Smokeless tobacco: Not on file  . Alcohol Use: Yes     occ    OB History    Grav Para Term Preterm Abortions TAB SAB Ect Mult Living                  Review of Systems  Constitutional: Negative for fever and chills.  HENT: Positive for neck pain (Mild. ). Negative for congestion and rhinorrhea.   Respiratory: Negative for cough and shortness of breath.   Cardiovascular: Negative for chest pain.  Gastrointestinal: Negative for nausea, vomiting and abdominal pain.  Musculoskeletal: Negative for back pain.       Right anterior knee pain.   Neurological: Negative for weakness.  All other systems reviewed and are negative.    Allergies  Ciprofloxacin; Metronidazole; and Toradol  Home Medications   Current Outpatient Rx  Name  Route Sig Dispense Refill  . ATENOLOL 25 MG PO TABS Oral Take 25 mg by mouth daily.     Marland Kitchen LISINOPRIL 10 MG PO TABS Oral Take 10 mg by mouth daily.     Marland Kitchen POTASSIUM GLUCONATE PO Oral Take 1 tablet by mouth daily.        Triage Vitals: BP 125/77  Pulse 67  Temp 98.5 F (36.9 C) (Oral)  Resp 18  SpO2 100%  LMP 03/29/2012  Physical Exam  Nursing note and vitals reviewed. Constitutional: She is oriented to person, place, and time. She appears well-developed and well-nourished. No distress.  HENT:  Head: Normocephalic and atraumatic.  Eyes: EOM are normal.  Neck: Normal range of motion. Neck supple. No tracheal deviation present.       C-spine is minimally tender.   Cardiovascular: Normal rate, regular rhythm and normal heart sounds.   Pulmonary/Chest: Effort normal. No respiratory distress.  Musculoskeletal: Normal range of motion.       Her back is non-tender. Tenderness with palpation over her right patella. Pain with anterior posterior drawer tests. Tenderness over her MCL with palpation. She is neurovascularly intact distal to her right knee and her tendon functions are intact.    Neurological: She is alert and oriented to person,  place, and time.       Normal sensation of her right leg.   Skin: Skin is warm and dry.  Psychiatric: She has a normal mood and affect. Her behavior is normal.    ED Course  Procedures (including critical care time) DIAGNOSTIC STUDIES: Oxygen Saturation is 100% on room air, normal by my interpretation.    COORDINATION OF CARE: At 1147 PM Discussed treatment plan with patient which includes Right knee and neck X-ray. Patient agrees.     MDM   Nursing notes and vitals signs, including pulse oximetry, reviewed.  Summary of this visit's results, reviewed by myself:  Imaging Studies: Dg Cervical Spine Complete  04/08/2012  *RADIOLOGY REPORT*  Clinical Data: Fall, neck pain  CERVICAL SPINE - COMPLETE 4+ VIEW  Comparison: None.  Findings: No  prevertebral soft tissue swelling.  Normal alignment of the cervical vertebral bodies.  No loss of vertebral body height or disc height.  Normal spinal laminar line.  Oblique projections are normal.  Open mouth odontoid view demonstrates normal alignment of  lateral masses of C1 on C2.  IMPRESSION: No radiographic evidence cervical spine injury.  Original Report Authenticated By: Genevive Bi, M.D.   Dg Knee Complete 4 Views Right  04/08/2012  *RADIOLOGY REPORT*  Clinical Data: Right knee pain status post fall  RIGHT KNEE - COMPLETE 4+ VIEW  Comparison: None.  Findings: No fracture or dislocation.  No joint effusion.  No aggressive osseous lesion.  IMPRESSION: No acute osseous abnormality of the right knee identified. If clinical concern for a fracture persists, recommend a repeat radiograph in 5-10 days to evaluate for interval change or callus formation.  Original Report Authenticated By: Waneta Martins, M.D.     I personally performed the services described in this documentation, which was scribed in my presence.  The recorded information has been reviewed and considered.         Hanley Seamen, MD 04/08/12 [phone]

## 2012-04-07 NOTE — ED Notes (Signed)
Pt states that she missed her step and fell down approximately 15 steps pt began having right knee pain that shoots upward to her buttocks

## 2012-04-08 MED ORDER — NAPROXEN 250 MG PO TABS
500.0000 mg | ORAL_TABLET | Freq: Once | ORAL | Status: AC
  Administered 2012-04-08: 500 mg via ORAL
  Filled 2012-04-08: qty 2

## 2012-04-08 MED ORDER — HYDROCODONE-ACETAMINOPHEN 5-325 MG PO TABS: 1.0000 | ORAL_TABLET | Freq: Four times a day (QID) | ORAL | Status: AC | PRN

## 2012-04-08 NOTE — Discharge Instructions (Signed)
Knee Pain The knee is the complex joint between your thigh and your lower leg. It is made up of bones, tendons, ligaments, and cartilage. The bones that make up the knee are:  The femur in the thigh.   The tibia and fibula in the lower leg.   The patella or kneecap riding in the groove on the lower femur.  CAUSES  Knee pain is a common complaint with many causes. A few of these causes are:  Injury, such as:   A ruptured ligament or tendon injury.   Torn cartilage.   Medical conditions, such as:   Gout   Arthritis   Infections   Overuse, over training or overdoing a physical activity.  Knee pain can be minor or severe. Knee pain can accompany debilitating injury. Minor knee problems often respond well to self-care measures or get well on their own. More serious injuries may need medical intervention or even surgery. SYMPTOMS The knee is complex. Symptoms of knee problems can vary widely. Some of the problems are:  Pain with movement and weight bearing.   Swelling and tenderness.   Buckling of the knee.   Inability to straighten or extend your knee.   Your knee locks and you cannot straighten it.   Warmth and redness with pain and fever.   Deformity or dislocation of the kneecap.  DIAGNOSIS  Determining what is wrong may be very straight forward such as when there is an injury. It can also be challenging because of the complexity of the knee. Tests to make a diagnosis may include:  Your caregiver taking a history and doing a physical exam.   Routine X-rays can be used to rule out other problems. X-rays will not reveal a cartilage tear. Some injuries of the knee can be diagnosed by:   Arthroscopy a surgical technique by which a small video camera is inserted through tiny incisions on the sides of the knee. This procedure is used to examine and repair internal knee joint problems. Tiny instruments can be used during arthroscopy to repair the torn knee cartilage  (meniscus).   Arthrography is a radiology technique. A contrast liquid is directly injected into the knee joint. Internal structures of the knee joint then become visible on X-ray film.   An MRI scan is a non x-ray radiology procedure in which magnetic fields and a computer produce two- or three-dimensional images of the inside of the knee. Cartilage tears are often visible using an MRI scanner. MRI scans have largely replaced arthrography in diagnosing cartilage tears of the knee.   Blood work.   Examination of the fluid that helps to lubricate the knee joint (synovial fluid). This is done by taking a sample out using a needle and a syringe.  TREATMENT The treatment of knee problems depends on the cause. Some of these treatments are:  Depending on the injury, proper casting, splinting, surgery or physical therapy care will be needed.   Give yourself adequate recovery time. Do not overuse your joints. If you begin to get sore during workout routines, back off. Slow down or do fewer repetitions.   For repetitive activities such as cycling or running, maintain your strength and nutrition.   Alternate muscle groups. For example if you are a weight lifter, work the upper body on one day and the lower body the next.   Either tight or weak muscles do not give the proper support for your knee. Tight or weak muscles do not absorb the stress placed   on the knee joint. Keep the muscles surrounding the knee strong.   Take care of mechanical problems.   If you have flat feet, orthotics or special shoes may help. See your caregiver if you need help.   Arch supports, sometimes with wedges on the inner or outer aspect of the heel, can help. These can shift pressure away from the side of the knee most bothered by osteoarthritis.   A brace called an "unloader" brace also may be used to help ease the pressure on the most arthritic side of the knee.   If your caregiver has prescribed crutches, braces,  wraps or ice, use as directed. The acronym for this is PRICE. This means protection, rest, ice, compression and elevation.   Nonsteroidal anti-inflammatory drugs (NSAID's), can help relieve pain. But if taken immediately after an injury, they may actually increase swelling. Take NSAID's with food in your stomach. Stop them if you develop stomach problems. Do not take these if you have a history of ulcers, stomach pain or bleeding from the bowel. Do not take without your caregiver's approval if you have problems with fluid retention, heart failure, or kidney problems.   For ongoing knee problems, physical therapy may be helpful.   Glucosamine and chondroitin are over-the-counter dietary supplements. Both may help relieve the pain of osteoarthritis in the knee. These medicines are different from the usual anti-inflammatory drugs. Glucosamine may decrease the rate of cartilage destruction.   Injections of a corticosteroid drug into your knee joint may help reduce the symptoms of an arthritis flare-up. They may provide pain relief that lasts a few months. You may have to wait a few months between injections. The injections do have a small increased risk of infection, water retention and elevated blood sugar levels.   Hyaluronic acid injected into damaged joints may ease pain and provide lubrication. These injections may work by reducing inflammation. A series of shots may give relief for as long as 6 months.   Topical painkillers. Applying certain ointments to your skin may help relieve the pain and stiffness of osteoarthritis. Ask your pharmacist for suggestions. Many over the-counter products are approved for temporary relief of arthritis pain.   In some countries, doctors often prescribe topical NSAID's for relief of chronic conditions such as arthritis and tendinitis. A review of treatment with NSAID creams found that they worked as well as oral medications but without the serious side effects.    PREVENTION  Maintain a healthy weight. Extra pounds put more strain on your joints.   Get strong, stay limber. Weak muscles are a common cause of knee injuries. Stretching is important. Include flexibility exercises in your workouts.   Be smart about exercise. If you have osteoarthritis, chronic knee pain or recurring injuries, you may need to change the way you exercise. This does not mean you have to stop being active. If your knees ache after jogging or playing basketball, consider switching to swimming, water aerobics or other low-impact activities, at least for a few days a week. Sometimes limiting high-impact activities will provide relief.   Make sure your shoes fit well. Choose footwear that is right for your sport.   Protect your knees. Use the proper gear for knee-sensitive activities. Use kneepads when playing volleyball or laying carpet. Buckle your seat belt every time you drive. Most shattered kneecaps occur in car accidents.   Rest when you are tired.  SEEK MEDICAL CARE IF:  You have knee pain that is continual and does not   seem to be getting better.  SEEK IMMEDIATE MEDICAL CARE IF:  Your knee joint feels hot to the touch and you have a high fever. MAKE SURE YOU:   Understand these instructions.   Will watch your condition.   Will get help right away if you are not doing well or get worse.  Document Released: 06/12/2007 Document Revised: 08/04/2011 Document Reviewed: 06/12/2007 [employer] Patient Information 2012 [employer]. 

## 2012-04-08 NOTE — ED Notes (Signed)
EMT at bedside applying knee immobilizer and crutches.

## 2012-04-28 ENCOUNTER — Encounter (HOSPITAL_COMMUNITY): Payer: Self-pay | Admitting: *Deleted

## 2012-04-28 ENCOUNTER — Inpatient Hospital Stay (HOSPITAL_COMMUNITY)
Admission: AD | Admit: 2012-04-28 | Discharge: 2012-04-28 | Disposition: A | Discharge status: Home / Self Care | Payer: Medicaid Other | Source: Ambulatory Visit | Attending: Obstetrics | Admitting: Obstetrics

## 2012-04-28 DIAGNOSIS — R109 Unspecified abdominal pain: Secondary | ICD-10-CM | POA: Insufficient documentation

## 2012-04-28 DIAGNOSIS — N940 Mittelschmerz: Secondary | ICD-10-CM

## 2012-04-28 HISTORY — DX: Benign neoplasm of connective and other soft tissue, unspecified: D21.9

## 2012-04-28 HISTORY — DX: Unspecified ovarian cyst, unspecified side: N83.209

## 2012-04-28 LAB — URINALYSIS, ROUTINE W REFLEX MICROSCOPIC
Ketones, ur: NEGATIVE mg/dL
Nitrite: NEGATIVE
Urobilinogen, UA: 0.2 mg/dL (ref 0.0–1.0)
pH: 5.5 (ref 5.0–8.0)

## 2012-04-28 LAB — URINE MICROSCOPIC-ADD ON

## 2012-04-28 LAB — WET PREP, GENITAL

## 2012-04-28 NOTE — MAU Note (Signed)
Patient states she has had left lower abdominal pain for 2 days that got worse last night. Has had nausea for a couple of days, thick white vaginal discharge.

## 2012-04-28 NOTE — MAU Provider Note (Signed)
History     CSN: [id number]  Arrival date and time: 04/28/12 1359   First Provider Initiated Contact with Patient 04/28/12 1508      Chief Complaint  Patient presents with  . Abdominal Pain  . Urinary Frequency   HPI 36 y.o. [id number] with left sided pain x 2-3 days, occurs when bladder is full, relieved with urination    Past Medical History  Diagnosis Date  . Hypertension   . Fibroid   . Ovarian cyst     Past Surgical History  Procedure Date  . Appendectomy   . Dilation and curettage of uterus   . Ovarian cyst removal     No family history on file.  History  Substance Use Topics  . Smoking status: Never Smoker   . Smokeless tobacco: Not on file  . Alcohol Use: Yes     occ    Allergies:  Allergies  Allergen Reactions  . Ciprofloxacin Shortness Of Breath, Diarrhea and Other (See Comments)    numbness  . Metronidazole Shortness Of Breath, Diarrhea and Other (See Comments)    numbness  . Toradol (Ketorolac Tromethamine) Anaphylaxis and Hives    Prescriptions prior to admission  Medication Sig Dispense Refill  . atenolol (TENORMIN) 25 MG tablet Take 25 mg by mouth daily.       Marland Kitchen lisinopril (PRINIVIL,ZESTRIL) 10 MG tablet Take 10 mg by mouth daily.       Marland Kitchen POTASSIUM GLUCONATE PO Take 1 tablet by mouth daily.          Review of Systems  Constitutional: Negative.   Respiratory: Negative.   Cardiovascular: Negative.   Gastrointestinal: Positive for abdominal pain. Negative for nausea, vomiting, diarrhea and constipation.  Genitourinary: Positive for dysuria. Negative for urgency, frequency, hematuria and flank pain.       Negative for vaginal bleeding, vaginal discharge, dyspareunia  Musculoskeletal: Negative.   Neurological: Negative.   Psychiatric/Behavioral: Negative.    Physical Exam   Blood pressure 126/65, pulse 74, temperature 99.3 F (37.4 C), temperature source Oral, resp. rate 18, height 5' 5.5" (1.664 m), weight 270 lb 12.8 oz (122.834 kg),  last menstrual period 04/20/2012, SpO2 100.00%.  Physical Exam  Nursing note and vitals reviewed. Constitutional: She is oriented to person, place, and time. She appears well-developed and well-nourished. No distress.  HENT:  Head: Normocephalic and atraumatic.  Cardiovascular: Normal rate, regular rhythm and normal heart sounds.   Respiratory: Effort normal and breath sounds normal. No respiratory distress.  GI: Soft. Bowel sounds are normal. She exhibits no distension and no mass. There is no tenderness. There is no rebound and no guarding.  Genitourinary: There is no rash or lesion on the right labia. There is no rash or lesion on the left labia. Uterus is not deviated, not enlarged, not fixed and not tender. Cervix exhibits no motion tenderness, no discharge and no friability. Right adnexum displays no mass, no tenderness and no fullness. Left adnexum displays no mass, no tenderness and no fullness. No erythema, tenderness or bleeding around the vagina. No vaginal discharge found.       "egg white" cervical mucous noted on exam   Neurological: She is alert and oriented to person, place, and time.  Skin: Skin is warm and dry.  Psychiatric: She has a normal mood and affect.    MAU Course  Procedures  Results for orders placed during the hospital encounter of 04/28/12 (from the past 72 hour(s))  URINALYSIS, ROUTINE W REFLEX MICROSCOPIC  Status: Abnormal   Collection Time   04/28/12  2:30 PM      Component Value Range Comment   Color, Urine YELLOW  YELLOW    APPearance CLEAR  CLEAR    Specific Gravity, Urine <1.005 (*) 1.005 - 1.030    pH 5.5  5.0 - 8.0    Glucose, UA NEGATIVE  NEGATIVE mg/dL    Hgb urine dipstick NEGATIVE  NEGATIVE    Bilirubin Urine NEGATIVE  NEGATIVE    Ketones, ur NEGATIVE  NEGATIVE mg/dL    Protein, ur NEGATIVE  NEGATIVE mg/dL    Urobilinogen, UA 0.2  0.0 - 1.0 mg/dL    Nitrite NEGATIVE  NEGATIVE    Leukocytes, UA SMALL (*) NEGATIVE   URINE  MICROSCOPIC-ADD ON     Status: Abnormal   Collection Time   04/28/12  2:30 PM      Component Value Range Comment   Squamous Epithelial / LPF FEW (*) RARE    WBC, UA 0-2  <3 WBC/hpf    RBC / HPF 0-2  <3 RBC/hpf    Bacteria, UA FEW (*) RARE   POCT PREGNANCY, URINE     Status: Normal   Collection Time   04/28/12  2:40 PM      Component Value Range Comment   Preg Test, Ur NEGATIVE  NEGATIVE   WET PREP, GENITAL     Status: Abnormal   Collection Time   04/28/12  3:53 PM      Component Value Range Comment   Yeast Wet Prep HPF POC NONE SEEN  NONE SEEN    Trich, Wet Prep NONE SEEN  NONE SEEN    Clue Cells Wet Prep HPF POC NONE SEEN  NONE SEEN    WBC, Wet Prep HPF POC MANY (*) NONE SEEN MODERATE BACTERIA SEEN     Assessment and Plan   1. Mittelschmerz    Medication List  As of 05/01/2012 10:15 AM   CONTINUE taking these medications         atenolol 25 MG tablet   Commonly known as: TENORMIN      lisinopril 10 MG tablet   Commonly known as: PRINIVIL,ZESTRIL      POTASSIUM GLUCONATE PO           Follow-up Information    Follow up with MARSHALL,BERNARD A, MD. (If symptoms worsen)    Contact information:   [address] [place] [address] [phone]            FRAZIER,NATALIE 04/28/2012, 3:09 PM

## 2012-05-01 LAB — GC/CHLAMYDIA PROBE AMP, GENITAL: Chlamydia, DNA Probe: NEGATIVE

## 2012-05-13 ENCOUNTER — Encounter (HOSPITAL_BASED_OUTPATIENT_CLINIC_OR_DEPARTMENT_OTHER): Payer: Self-pay | Admitting: *Deleted

## 2012-05-13 ENCOUNTER — Emergency Department (HOSPITAL_BASED_OUTPATIENT_CLINIC_OR_DEPARTMENT_OTHER)
Admission: EM | Admit: 2012-05-13 | Discharge: 2012-05-13 | Disposition: A | Discharge status: Home / Self Care | Payer: Medicaid Other | Attending: Emergency Medicine | Admitting: Emergency Medicine

## 2012-05-13 DIAGNOSIS — B9689 Other specified bacterial agents as the cause of diseases classified elsewhere: Secondary | ICD-10-CM

## 2012-05-13 DIAGNOSIS — N939 Abnormal uterine and vaginal bleeding, unspecified: Secondary | ICD-10-CM

## 2012-05-13 DIAGNOSIS — N898 Other specified noninflammatory disorders of vagina: Secondary | ICD-10-CM | POA: Insufficient documentation

## 2012-05-13 DIAGNOSIS — I10 Essential (primary) hypertension: Secondary | ICD-10-CM | POA: Insufficient documentation

## 2012-05-13 LAB — URINE MICROSCOPIC-ADD ON

## 2012-05-13 LAB — CBC WITH DIFFERENTIAL/PLATELET
Eosinophils Relative: 1 % (ref 0–5)
HCT: 34.8 % — ABNORMAL LOW (ref 36.0–46.0)
Lymphocytes Relative: 26 % (ref 12–46)
Lymphs Abs: 1.6 10*3/uL (ref 0.7–4.0)
MCV: 83.3 fL (ref 78.0–100.0)
Monocytes Absolute: 0.7 10*3/uL (ref 0.1–1.0)
Neutro Abs: 3.7 10*3/uL (ref 1.7–7.7)
RBC: 4.18 MIL/uL (ref 3.87–5.11)
WBC: 6.1 10*3/uL (ref 4.0–10.5)

## 2012-05-13 LAB — BASIC METABOLIC PANEL
CO2: 24 mEq/L (ref 19–32)
Calcium: 9.2 mg/dL (ref 8.4–10.5)
Chloride: 97 mEq/L (ref 96–112)
Creatinine, Ser: 1.5 mg/dL — ABNORMAL HIGH (ref 0.50–1.10)
Glucose, Bld: 101 mg/dL — ABNORMAL HIGH (ref 70–99)
Sodium: 135 mEq/L (ref 135–145)

## 2012-05-13 LAB — URINALYSIS, ROUTINE W REFLEX MICROSCOPIC
Bilirubin Urine: NEGATIVE
Glucose, UA: NEGATIVE mg/dL
Ketones, ur: 15 mg/dL — AB
Protein, ur: NEGATIVE mg/dL
Urobilinogen, UA: 1 mg/dL (ref 0.0–1.0)

## 2012-05-13 LAB — WET PREP, GENITAL
Trich, Wet Prep: NONE SEEN
Yeast Wet Prep HPF POC: NONE SEEN

## 2012-05-13 MED ORDER — METRONIDAZOLE 500 MG PO TABS: 500.0000 mg | ORAL_TABLET | Freq: Two times a day (BID) | ORAL | Status: AC

## 2012-05-13 NOTE — ED Notes (Signed)
Pt states she has had heavy vaginal bleeding x 8-9 days. Using Super pads x 3/day. Placed on Progestin on the 10th, but is still bleeding. Also given Bactrim for ? Infection. Hx cyst.

## 2012-05-13 NOTE — ED Provider Notes (Signed)
History     CSN: [id number]  Arrival date & time 05/13/12  1821   First MD Initiated Contact with Patient 05/13/12 1918      Chief Complaint  Patient presents with  . Vaginal Bleeding    (Consider location/radiation/quality/duration/timing/severity/associated sxs/prior treatment) Patient is a 36 y.o. female presenting with vaginal bleeding. The history is provided by the patient. No language interpreter was used.  Vaginal Bleeding This is a new problem. The current episode started 1 to 4 weeks ago. The problem occurs constantly. The problem has been gradually worsening. Nothing aggravates the symptoms. She has tried nothing for the symptoms. The treatment provided moderate relief.   Pt complains of a vaginal bleeding for over a month.  Pt saw Dr. Mayford Knife who treated her with provera.  Pt reports bleeding did not stop, Past Medical History  Diagnosis Date  . Hypertension   . Fibroid   . Ovarian cyst     Past Surgical History  Procedure Date  . Appendectomy   . Dilation and curettage of uterus   . Ovarian cyst removal     History reviewed. No pertinent family history.  History  Substance Use Topics  . Smoking status: Never Smoker   . Smokeless tobacco: Not on file  . Alcohol Use: Yes     occ    OB History    Grav Para Term Preterm Abortions TAB SAB Ect Mult Living   3 1 1  2  2   1       Review of Systems  Genitourinary: Positive for vaginal bleeding.  All other systems reviewed and are negative.    Allergies  Ciprofloxacin; Metronidazole; and Toradol  Home Medications   Current Outpatient Rx  Name Route Sig Dispense Refill  . ATENOLOL-CHLORTHALIDONE 50-25 MG PO TABS Oral Take 1 tablet by mouth daily.    Marland Kitchen GENTLE LAXATIVE PO Oral Take 2 capsules by mouth daily as needed. For constipation.    Marland Kitchen LISINOPRIL 10 MG PO TABS Oral Take 10 mg by mouth daily.     Marland Kitchen POTASSIUM CHLORIDE ER 10 MEQ PO TBCR Oral Take 10 mEq by mouth 2 (two) times daily.    .  SULFAMETHOXAZOLE-TRIMETHOPRIM 800-160 MG PO TABS Oral Take 1 tablet by mouth 2 (two) times daily.      BP 117/77  Pulse 72  Temp 98.2 F (36.8 C) (Oral)  Resp 20  Ht 5\' 6"  (1.676 m)  Wt 270 lb (122.471 kg)  BMI 43.58 kg/m2  SpO2 100%  LMP 04/20/2012  Physical Exam  Nursing note and vitals reviewed. Constitutional: She appears well-developed and well-nourished.  HENT:  Head: Normocephalic.  Eyes: Pupils are equal, round, and reactive to light.  Neck: Normal range of motion.  Cardiovascular: Normal rate and normal heart sounds.   Pulmonary/Chest: Effort normal.  Abdominal: Soft.  Genitourinary:       Scant vaginal bleeding  Neurological: She is alert.  Skin: Skin is warm.  Psychiatric: She has a normal mood and affect.    ED Course  Procedures (including critical care time)  Labs Reviewed  URINALYSIS, ROUTINE W REFLEX MICROSCOPIC - Abnormal; Notable for the following:    APPearance CLOUDY (*)     Hgb urine dipstick LARGE (*)     Ketones, ur 15 (*)     Leukocytes, UA SMALL (*)     All other components within normal limits  URINE MICROSCOPIC-ADD ON - Abnormal; Notable for the following:    Squamous Epithelial / LPF MANY (*)  Bacteria, UA FEW (*)     All other components within normal limits  CBC WITH DIFFERENTIAL - Abnormal; Notable for the following:    Hemoglobin 11.7 (*)     HCT 34.8 (*)     All other components within normal limits  PREGNANCY, URINE  BASIC METABOLIC PANEL  WET PREP, GENITAL  GC/CHLAMYDIA PROBE AMP, GENITAL   No results found.   No diagnosis found.    MDM  Flagyl  Rx.   Call Dr. Gaynell [name] and schedule appointment to be seen for evaluation        Elson Areas, [place] 05/13/12 2234

## 2012-05-14 NOTE — ED Provider Notes (Signed)
Medical screening examination/treatment/procedure(s) were performed by non-physician practitioner and as supervising physician I was immediately available for consultation/collaboration.  Tobin Chad, MD 05/14/12 [phone]

## 2012-05-15 LAB — GC/CHLAMYDIA PROBE AMP, GENITAL
Chlamydia, DNA Probe: NEGATIVE
GC Probe Amp, Genital: NEGATIVE

## 2012-06-06 ENCOUNTER — Encounter (INDEPENDENT_AMBULATORY_CARE_PROVIDER_SITE_OTHER): Payer: Self-pay

## 2012-12-20 ENCOUNTER — Encounter (HOSPITAL_COMMUNITY): Payer: Self-pay | Admitting: Cardiology

## 2012-12-20 ENCOUNTER — Emergency Department (HOSPITAL_COMMUNITY)
Admission: EM | Admit: 2012-12-20 | Discharge: 2012-12-20 | Disposition: A | Discharge status: Home / Self Care | Payer: Medicaid Other | Attending: Emergency Medicine | Admitting: Emergency Medicine

## 2012-12-20 DIAGNOSIS — R490 Dysphonia: Secondary | ICD-10-CM | POA: Insufficient documentation

## 2012-12-20 DIAGNOSIS — R110 Nausea: Secondary | ICD-10-CM | POA: Insufficient documentation

## 2012-12-20 DIAGNOSIS — R42 Dizziness and giddiness: Secondary | ICD-10-CM | POA: Insufficient documentation

## 2012-12-20 DIAGNOSIS — I10 Essential (primary) hypertension: Secondary | ICD-10-CM | POA: Insufficient documentation

## 2012-12-20 DIAGNOSIS — Z8742 Personal history of other diseases of the female genital tract: Secondary | ICD-10-CM | POA: Insufficient documentation

## 2012-12-20 DIAGNOSIS — S0990XA Unspecified injury of head, initial encounter: Secondary | ICD-10-CM | POA: Insufficient documentation

## 2012-12-20 DIAGNOSIS — R51 Headache: Secondary | ICD-10-CM | POA: Insufficient documentation

## 2012-12-20 DIAGNOSIS — Z79899 Other long term (current) drug therapy: Secondary | ICD-10-CM | POA: Insufficient documentation

## 2012-12-20 DIAGNOSIS — Y939 Activity, unspecified: Secondary | ICD-10-CM | POA: Insufficient documentation

## 2012-12-20 DIAGNOSIS — W2203XA Walked into furniture, initial encounter: Secondary | ICD-10-CM | POA: Insufficient documentation

## 2012-12-20 DIAGNOSIS — Y929 Unspecified place or not applicable: Secondary | ICD-10-CM | POA: Insufficient documentation

## 2012-12-20 MED ORDER — OXYCODONE-ACETAMINOPHEN 5-325 MG PO TABS
2.0000 | ORAL_TABLET | Freq: Once | ORAL | Status: DC
  Filled 2012-12-20: qty 2

## 2012-12-20 MED ORDER — TRAMADOL HCL 50 MG PO TABS
50.0000 mg | ORAL_TABLET | Freq: Once | ORAL | Status: AC
  Administered 2012-12-20: 50 mg via ORAL
  Filled 2012-12-20 (×2): qty 1

## 2012-12-20 MED ORDER — TRAMADOL HCL 50 MG PO TABS: 50.0000 mg | ORAL_TABLET | Freq: Four times a day (QID) | ORAL | Status: DC | PRN

## 2012-12-20 NOTE — ED Provider Notes (Signed)
Medical screening examination/treatment/procedure(s) were performed by non-physician practitioner and as supervising physician I was immediately available for consultation/collaboration.   Lyanne Co, MD 12/20/12 1345

## 2012-12-20 NOTE — ED Provider Notes (Signed)
History     CSN: [id number]  Arrival date & time 12/20/12  1217   First MD Initiated Contact with Patient 12/20/12 1238      Chief Complaint  Patient presents with  . Headache  . Nausea    (Consider location/radiation/quality/duration/timing/severity/associated sxs/prior treatment) HPI Comments: 37 y/o female presents to the ED complaining of a headache x 2 days. Despite triage summary, patient is not complaining of a migraine. States she was "hoarsing around" with her daughter last night when she hit the right side of her head on a table. Denies LOC. Initially she felt fine, however later in the night she developed a severe headache rated 10/10 with associated nausea. No vomiting. Nausea has since subsided. Tried taking tylenol without much relief, however throughout the morning today pain decreased to 5/10. Admits to associated dizziness when she stand quickly. Denies visual disturbance, photophobia, confusion, wound.  Patient is a 37 y.o. female presenting with headaches. The history is provided by the patient.  Headache Associated symptoms: dizziness and nausea   Associated symptoms: no neck pain, no neck stiffness and no vomiting     Past Medical History  Diagnosis Date  . Hypertension   . Fibroid   . Ovarian cyst     Past Surgical History  Procedure Laterality Date  . Appendectomy    . Dilation and curettage of uterus    . Ovarian cyst removal      History reviewed. No pertinent family history.  History  Substance Use Topics  . Smoking status: Never Smoker   . Smokeless tobacco: Not on file  . Alcohol Use: Yes     Comment: occ    OB History   Grav Para Term Preterm Abortions TAB SAB Ect Mult Living   3 1 1  2  2   1       Review of Systems  HENT: Negative for neck pain and neck stiffness.   Eyes: Negative for visual disturbance.  Gastrointestinal: Positive for nausea. Negative for vomiting.  Skin: Negative for wound.  Neurological: Positive for  dizziness and headaches.  All other systems reviewed and are negative.    Allergies  Ciprofloxacin; Metronidazole; and Toradol  Home Medications   Current Outpatient Rx  Name  Route  Sig  Dispense  Refill  . acetaminophen (TYLENOL) 500 MG tablet   Oral   Take 1,000 mg by mouth every 6 (six) hours as needed for pain.         Marland Kitchen atenolol-chlorthalidone (TENORETIC) 50-25 MG per tablet   Oral   Take 1 tablet by mouth daily.         Marland Kitchen lisinopril (PRINIVIL,ZESTRIL) 10 MG tablet   Oral   Take 10 mg by mouth daily.          . potassium chloride (K-DUR) 10 MEQ tablet   Oral   Take 10 mEq by mouth 2 (two) times daily.           BP 121/74  Pulse 87  Temp(Src) 98 F (36.7 C) (Oral)  Resp 22  SpO2 97%  Physical Exam  Nursing note and vitals reviewed. Constitutional: She is oriented to person, place, and time. She appears well-developed and well-nourished. No distress.  HENT:  Head: Normocephalic and atraumatic.  Eyes: Conjunctivae and EOM are normal. Pupils are equal, round, and reactive to light.  Neck: Normal range of motion. Neck supple. No spinous process tenderness present.  Cardiovascular: Normal rate, regular rhythm, normal heart sounds and intact distal pulses.  Pulmonary/Chest: Effort normal and breath sounds normal. No respiratory distress.  Musculoskeletal: Normal range of motion. She exhibits no edema.  Neurological: She is alert and oriented to person, place, and time. She has normal strength. No cranial nerve deficit or sensory deficit. She displays a negative Romberg sign. Coordination and gait normal.  Skin: Skin is warm and dry.  Psychiatric: She has a normal mood and affect. Her speech is normal and behavior is normal. Cognition and memory are normal.    ED Course  Procedures (including critical care time)  Labs Reviewed - No data to display No results found.   1. Headache   2. Head injury, initial encounter       MDM  Headache after  hitting head- no LOC. No red flags concerning patient's headache. Neuro exam unremarkable. Currently not nauseated. Headache decreased to 1/10 with tramadol. Stable for discharge. Return precautions discussed. Patient states understanding of plan and is agreeable.        Trevor Mace, PA-C 12/20/12 1343

## 2012-12-20 NOTE — ED Notes (Addendum)
Pt fell when "hoarsing around" and hit the right side of her head on table corner. Pt denies LOC, denies bleeding at time of injury. Pt states she is able to tolerate light at this time which is different from her usual migraines. Pt states she feels lightheaded when standing. Denies visual changes.

## 2012-12-20 NOTE — ED Notes (Signed)
Pt reports migraine headache over the past 2 days. Reports she has home medications but is out. States she has had some nausea and dizziness but no vomiting. Denies any vision changes. No neuro deficits.

## 2013-02-11 ENCOUNTER — Encounter (HOSPITAL_BASED_OUTPATIENT_CLINIC_OR_DEPARTMENT_OTHER): Payer: Self-pay

## 2013-02-11 ENCOUNTER — Emergency Department (HOSPITAL_BASED_OUTPATIENT_CLINIC_OR_DEPARTMENT_OTHER)
Admission: EM | Admit: 2013-02-11 | Discharge: 2013-02-11 | Disposition: A | Discharge status: Home / Self Care | Payer: Medicaid Other | Attending: Emergency Medicine | Admitting: Emergency Medicine

## 2013-02-11 DIAGNOSIS — R350 Frequency of micturition: Secondary | ICD-10-CM | POA: Insufficient documentation

## 2013-02-11 DIAGNOSIS — Z3202 Encounter for pregnancy test, result negative: Secondary | ICD-10-CM | POA: Insufficient documentation

## 2013-02-11 DIAGNOSIS — Z8742 Personal history of other diseases of the female genital tract: Secondary | ICD-10-CM | POA: Insufficient documentation

## 2013-02-11 DIAGNOSIS — I10 Essential (primary) hypertension: Secondary | ICD-10-CM | POA: Insufficient documentation

## 2013-02-11 DIAGNOSIS — Z79899 Other long term (current) drug therapy: Secondary | ICD-10-CM | POA: Insufficient documentation

## 2013-02-11 DIAGNOSIS — Z8559 Personal history of malignant neoplasm of other urinary tract organ: Secondary | ICD-10-CM | POA: Insufficient documentation

## 2013-02-11 LAB — CBC WITH DIFFERENTIAL/PLATELET
Basophils Relative: 0 % (ref 0–1)
Eosinophils Relative: 5 % (ref 0–5)
HCT: 37 % (ref 36.0–46.0)
Hemoglobin: 12.6 g/dL (ref 12.0–15.0)
Lymphocytes Relative: 23 % (ref 12–46)
Monocytes Absolute: 0.6 10*3/uL (ref 0.1–1.0)
Monocytes Relative: 8 % (ref 3–12)
Neutrophils Relative %: 64 % (ref 43–77)
RBC: 4.44 MIL/uL (ref 3.87–5.11)
Smear Review: ADEQUATE
WBC: 7.2 10*3/uL (ref 4.0–10.5)

## 2013-02-11 LAB — BASIC METABOLIC PANEL
BUN: 12 mg/dL (ref 6–23)
Chloride: 101 mEq/L (ref 96–112)
GFR calc non Af Amer: 58 mL/min — ABNORMAL LOW (ref >90)
Glucose, Bld: 98 mg/dL (ref 70–99)
Potassium: 3.8 mEq/L (ref 3.5–5.1)
Sodium: 138 mEq/L (ref 135–145)

## 2013-02-11 LAB — URINALYSIS, ROUTINE W REFLEX MICROSCOPIC
Bilirubin Urine: NEGATIVE
Hgb urine dipstick: NEGATIVE
Nitrite: NEGATIVE
Protein, ur: NEGATIVE mg/dL
Specific Gravity, Urine: 1.019 (ref 1.005–1.030)
Urobilinogen, UA: 0.2 mg/dL (ref 0.0–1.0)

## 2013-02-11 LAB — URINE MICROSCOPIC-ADD ON

## 2013-02-11 MED ORDER — NITROFURANTOIN MONOHYD MACRO 100 MG PO CAPS: 100.0000 mg | ORAL_CAPSULE | Freq: Two times a day (BID) | ORAL | Status: AC

## 2013-02-11 NOTE — ED Notes (Signed)
C/o dysuria,freq, n/v x 2 weeks

## 2013-02-11 NOTE — ED Notes (Signed)
Pt with poor vein selection- A Adkins,RN attempted at right [facility] w/o success-labs drawn left inner wrist first attempt-slow return

## 2013-02-11 NOTE — ED Provider Notes (Signed)
History     CSN: [id number]  Arrival date & time 02/11/13  1157   First MD Initiated Contact with Patient 02/11/13 1214      Chief Complaint  Patient presents with  . Dysuria    (Consider location/radiation/quality/duration/timing/severity/associated sxs/prior treatment) Patient is a 37 y.o. female presenting with frequency. The history is provided by the patient. No language interpreter was used.  Urinary Frequency This is a new problem. The current episode started 1 to 4 weeks ago. The problem occurs constantly. The problem has been gradually worsening. Pertinent negatives include no abdominal pain. Nothing aggravates the symptoms. She has tried nothing for the symptoms. The treatment provided no relief.  Pt reports increased frequency.  Pt complains of feeling weak   Past Medical History  Diagnosis Date  . Hypertension   . Fibroid   . Ovarian cyst     Past Surgical History  Procedure Laterality Date  . Appendectomy    . Dilation and curettage of uterus    . Ovarian cyst removal      No family history on file.  History  Substance Use Topics  . Smoking status: Never Smoker   . Smokeless tobacco: Not on file  . Alcohol Use: Yes     Comment: occ    OB History   Grav Para Term Preterm Abortions TAB SAB Ect Mult Living   3 1 1  2  2   1       Review of Systems  Gastrointestinal: Negative for abdominal pain.  Genitourinary: Positive for frequency.  All other systems reviewed and are negative.    Allergies  Ciprofloxacin; Metronidazole; and Toradol  Home Medications   Current Outpatient Rx  Name  Route  Sig  Dispense  Refill  . acetaminophen (TYLENOL) 500 MG tablet   Oral   Take 1,000 mg by mouth every 6 (six) hours as needed for pain.         Marland Kitchen atenolol-chlorthalidone (TENORETIC) 50-25 MG per tablet   Oral   Take 1 tablet by mouth daily.         Marland Kitchen lisinopril (PRINIVIL,ZESTRIL) 10 MG tablet   Oral   Take 10 mg by mouth daily.          .  potassium chloride (K-DUR) 10 MEQ tablet   Oral   Take 10 mEq by mouth 2 (two) times daily.         . traMADol (ULTRAM) 50 MG tablet   Oral   Take 1 tablet (50 mg total) by mouth every 6 (six) hours as needed for pain.   15 tablet   0     BP 132/75  Pulse 88  Temp(Src) 99 F (37.2 C) (Oral)  Resp 20  Ht 5\' 6"  (1.676 m)  Wt 260 lb (117.935 kg)  BMI 41.99 kg/m2  SpO2 100%  LMP 01/17/2013  Physical Exam  Nursing note and vitals reviewed. Constitutional: She is oriented to person, place, and time. She appears well-developed and well-nourished.  HENT:  Head: Normocephalic.  Right Ear: External ear normal.  Left Ear: External ear normal.  Nose: Nose normal.  Mouth/Throat: Oropharynx is clear and moist.  Eyes: Conjunctivae and EOM are normal. Pupils are equal, round, and reactive to light.  Neck: Normal range of motion. Neck supple.  Cardiovascular: Normal rate and regular rhythm.   Pulmonary/Chest: Effort normal.  Abdominal: Soft. There is no tenderness.  Musculoskeletal: Normal range of motion.  Neurological: She is alert and oriented to person, place,  and time. She has normal reflexes.  Skin: Skin is warm.  Psychiatric: She has a normal mood and affect.    ED Course  Procedures (including critical care time)  Labs Reviewed  URINALYSIS, ROUTINE W REFLEX MICROSCOPIC - Abnormal; Notable for the following:    Leukocytes, UA MODERATE (*)    All other components within normal limits  PREGNANCY, URINE  URINE MICROSCOPIC-ADD ON   No results found.   1. Urinary frequency       MDM   Results for orders placed during the hospital encounter of 02/11/13  URINALYSIS, ROUTINE W REFLEX MICROSCOPIC      Result Value Range   Color, Urine YELLOW  YELLOW   APPearance CLEAR  CLEAR   Specific Gravity, Urine 1.019  1.005 - 1.030   pH 5.5  5.0 - 8.0   Glucose, UA NEGATIVE  NEGATIVE mg/dL   Hgb urine dipstick NEGATIVE  NEGATIVE   Bilirubin Urine NEGATIVE  NEGATIVE    Ketones, ur NEGATIVE  NEGATIVE mg/dL   Protein, ur NEGATIVE  NEGATIVE mg/dL   Urobilinogen, UA 0.2  0.0 - 1.0 mg/dL   Nitrite NEGATIVE  NEGATIVE   Leukocytes, UA MODERATE (*) NEGATIVE  PREGNANCY, URINE      Result Value Range   Preg Test, Ur NEGATIVE  NEGATIVE  URINE MICROSCOPIC-ADD ON      Result Value Range   Squamous Epithelial / LPF RARE  RARE   WBC, UA 3-6  <3 WBC/hpf   RBC / HPF 0-2  <3 RBC/hpf   Bacteria, UA RARE  RARE   Urine-Other MUCOUS PRESENT    CBC WITH DIFFERENTIAL      Result Value Range   WBC 7.2  4.0 - 10.5 K/uL   RBC 4.44  3.87 - 5.11 MIL/uL   Hemoglobin 12.6  12.0 - 15.0 g/dL   HCT 16.1  09.6 - 04.5 %   MCV 83.3  78.0 - 100.0 fL   MCH 28.4  26.0 - 34.0 pg   MCHC 34.1  30.0 - 36.0 g/dL   RDW 40.9  81.1 - 91.4 %   Platelets    150 - 400 K/uL   Value: PLATELET CLUMPS NOTED ON SMEAR, COUNT APPEARS ADEQUATE   Neutrophils Relative % 64  43 - 77 %   Lymphocytes Relative 23  12 - 46 %   Monocytes Relative 8  3 - 12 %   Eosinophils Relative 5  0 - 5 %   Basophils Relative 0  0 - 1 %   Smear Review       Value: PLATELET CLUMPS NOTED ON SMEAR, COUNT APPEARS ADEQUATE   Neutro Abs 4.5  1.7 - 7.7 K/uL   Lymphs Abs 1.7  0.7 - 4.0 K/uL   Monocytes Absolute 0.6  0.1 - 1.0 K/uL   Eosinophils Absolute 0.4  0.0 - 0.7 K/uL   Basophils Absolute 0.0  0.0 - 0.1 K/uL  BASIC METABOLIC PANEL      Result Value Range   Sodium 138  135 - 145 mEq/L   Potassium 3.8  3.5 - 5.1 mEq/L   Chloride 101  96 - 112 mEq/L   CO2 25  19 - 32 mEq/L   Glucose, Bld 98  70 - 99 mg/dL   BUN 12  6 - 23 mg/dL   Creatinine, Ser 7.82 (*) 0.50 - 1.10 mg/dL   Calcium 95.6  8.4 - 21.3 mg/dL   GFR calc non Af Amer 58 (*) >90 mL/min  GFR calc Af Amer 67 (*) >90 mL/min   No results found.  I will culture urine,   Pt advised increased fluids,   Macrobidx 3 days      Elson Areas, PA-C 02/11/13 1448  Lonia Skinner Rodri­guez [place], [place] 02/11/13 1449

## 2013-02-11 NOTE — ED Provider Notes (Signed)
Medical screening examination/treatment/procedure(s) were performed by non-physician practitioner and as supervising physician I was immediately available for consultation/collaboration.   Nelia Shi, MD 02/11/13 2213

## 2013-04-18 ENCOUNTER — Emergency Department (HOSPITAL_BASED_OUTPATIENT_CLINIC_OR_DEPARTMENT_OTHER)
Admission: EM | Admit: 2013-04-18 | Discharge: 2013-04-18 | Disposition: A | Discharge status: Home / Self Care | Payer: Medicaid Other | Attending: Emergency Medicine | Admitting: Emergency Medicine

## 2013-04-18 ENCOUNTER — Encounter (HOSPITAL_BASED_OUTPATIENT_CLINIC_OR_DEPARTMENT_OTHER): Payer: Self-pay | Admitting: *Deleted

## 2013-04-18 DIAGNOSIS — R05 Cough: Secondary | ICD-10-CM | POA: Insufficient documentation

## 2013-04-18 DIAGNOSIS — R059 Cough, unspecified: Secondary | ICD-10-CM | POA: Insufficient documentation

## 2013-04-18 DIAGNOSIS — J40 Bronchitis, not specified as acute or chronic: Secondary | ICD-10-CM | POA: Insufficient documentation

## 2013-04-18 DIAGNOSIS — Z79899 Other long term (current) drug therapy: Secondary | ICD-10-CM | POA: Insufficient documentation

## 2013-04-18 DIAGNOSIS — Z8742 Personal history of other diseases of the female genital tract: Secondary | ICD-10-CM | POA: Insufficient documentation

## 2013-04-18 DIAGNOSIS — I10 Essential (primary) hypertension: Secondary | ICD-10-CM | POA: Insufficient documentation

## 2013-04-18 MED ORDER — ALBUTEROL SULFATE HFA 108 (90 BASE) MCG/ACT IN AERS: 2.0000 | INHALATION_SPRAY | RESPIRATORY_TRACT | Status: DC | PRN

## 2013-04-18 MED ORDER — AEROCHAMBER Z-STAT PLUS/MEDIUM MISC
1.0000 | Freq: Once | Status: AC
  Administered 2013-04-18: 1
  Filled 2013-04-18: qty 1

## 2013-04-18 MED ORDER — ALBUTEROL SULFATE HFA 108 (90 BASE) MCG/ACT IN AERS
2.0000 | INHALATION_SPRAY | Freq: Once | RESPIRATORY_TRACT | Status: AC
  Administered 2013-04-18: 2 via RESPIRATORY_TRACT
  Filled 2013-04-18: qty 6.7

## 2013-04-18 NOTE — ED Provider Notes (Signed)
CSN: [id number]     Arrival date & time 04/18/13  1804 History     First MD Initiated Contact with Patient 04/18/13 1820     Chief Complaint  Patient presents with  . Sore Throat  . Cough   (Consider location/radiation/quality/duration/timing/severity/associated sxs/prior Treatment) Patient is a 37 y.o. female presenting with pharyngitis and cough. The history is provided by the patient.  Sore Throat This is a new problem. The current episode started more than 1 week ago. The problem occurs constantly. The problem has been gradually improving. Pertinent negatives include no chest pain, no abdominal pain, no headaches and no shortness of breath. Nothing aggravates the symptoms. Nothing relieves the symptoms. She has tried nothing for the symptoms. The treatment provided no relief.  Cough Associated symptoms: no chest pain, no headaches and no shortness of breath     Past Medical History  Diagnosis Date  . Hypertension   . Fibroid   . Ovarian cyst    Past Surgical History  Procedure Laterality Date  . Appendectomy    . Dilation and curettage of uterus    . Ovarian cyst removal     History reviewed. No pertinent family history. History  Substance Use Topics  . Smoking status: Never Smoker   . Smokeless tobacco: Not on file  . Alcohol Use: Yes     Comment: occ   OB History   Grav Para Term Preterm Abortions TAB SAB Ect Mult Living   3 1 1  2  2   1      Review of Systems  Respiratory: Positive for cough. Negative for shortness of breath.   Cardiovascular: Negative for chest pain.  Gastrointestinal: Negative for abdominal pain.  Neurological: Negative for headaches.  All other systems reviewed and are negative.    Allergies  Ciprofloxacin; Metronidazole; and Toradol  Home Medications   Current Outpatient Rx  Name  Route  Sig  Dispense  Refill  . acetaminophen (TYLENOL) 500 MG tablet   Oral   Take 1,000 mg by mouth every 6 (six) hours as needed for pain.          Marland Kitchen atenolol-chlorthalidone (TENORETIC) 50-25 MG per tablet   Oral   Take 1 tablet by mouth daily.         Marland Kitchen lisinopril (PRINIVIL,ZESTRIL) 10 MG tablet   Oral   Take 10 mg by mouth daily.          . potassium chloride (K-DUR) 10 MEQ tablet   Oral   Take 10 mEq by mouth 2 (two) times daily.         . traMADol (ULTRAM) 50 MG tablet   Oral   Take 1 tablet (50 mg total) by mouth every 6 (six) hours as needed for pain.   15 tablet   0    BP 133/76  Pulse 101  Temp(Src) 99 F (37.2 C) (Oral)  Resp 20  SpO2 100%  LMP 04/15/2013 Physical Exam  Nursing note and vitals reviewed. Constitutional: She is oriented to person, place, and time. She appears well-developed and well-nourished.  HENT:  Head: Normocephalic and atraumatic.  Right Ear: External ear normal.  Left Ear: External ear normal.  Nose: Nose normal.  Mouth/Throat: Oropharynx is clear and moist.  Eyes: Conjunctivae and EOM are normal. Pupils are equal, round, and reactive to light.  Neck: Normal range of motion. Neck supple.  Cardiovascular: Normal rate, regular rhythm, normal heart sounds and intact distal pulses.   Hr 80  Pulmonary/Chest:  Effort normal and breath sounds normal.  Abdominal: Soft. Bowel sounds are normal.  Musculoskeletal: Normal range of motion.  Neurological: She is alert and oriented to person, place, and time. She has normal reflexes.  Skin: Skin is warm and dry.  Psychiatric: She has a normal mood and affect. Her behavior is normal. Thought content normal.    ED Course   Procedures (including critical care time)  Labs Reviewed - No data to display No results found. No diagnosis found.  MDM  Patient with uri symptoms for two weeks.  Family member with similar symtpoms. No dyspnea or productive cough. Plan inhaler for cough.   Hilario Quarry, MD 04/18/13 [phone]

## 2013-04-18 NOTE — ED Notes (Signed)
Pt amb to triage with quick steady gait in nad. Pt reports sore throat and cough x 1 week.

## 2013-06-28 ENCOUNTER — Inpatient Hospital Stay (HOSPITAL_COMMUNITY)
Admission: AD | Admit: 2013-06-28 | Discharge: 2013-06-28 | Disposition: A | Discharge status: Home / Self Care | Payer: Medicaid Other | Source: Ambulatory Visit | Attending: Obstetrics | Admitting: Obstetrics

## 2013-06-28 ENCOUNTER — Encounter (HOSPITAL_COMMUNITY): Payer: Self-pay | Admitting: *Deleted

## 2013-06-28 DIAGNOSIS — M545 Low back pain, unspecified: Secondary | ICD-10-CM | POA: Insufficient documentation

## 2013-06-28 DIAGNOSIS — Z3202 Encounter for pregnancy test, result negative: Secondary | ICD-10-CM

## 2013-06-28 DIAGNOSIS — I10 Essential (primary) hypertension: Secondary | ICD-10-CM

## 2013-06-28 HISTORY — DX: Other complications of anesthesia, initial encounter: T88.59XA

## 2013-06-28 HISTORY — DX: Adverse effect of unspecified anesthetic, initial encounter: T41.45XA

## 2013-06-28 LAB — URINALYSIS, ROUTINE W REFLEX MICROSCOPIC
Bilirubin Urine: NEGATIVE
Glucose, UA: NEGATIVE mg/dL
Hgb urine dipstick: NEGATIVE
Ketones, ur: NEGATIVE mg/dL
Nitrite: NEGATIVE
Specific Gravity, Urine: 1.01 (ref 1.005–1.030)
pH: 5.5 (ref 5.0–8.0)

## 2013-06-28 LAB — POCT PREGNANCY, URINE: Preg Test, Ur: NEGATIVE

## 2013-06-28 LAB — WET PREP, GENITAL
Clue Cells Wet Prep HPF POC: NONE SEEN
Trich, Wet Prep: NONE SEEN

## 2013-06-28 MED ORDER — IBUPROFEN 600 MG PO TABS
600.0000 mg | ORAL_TABLET | Freq: Once | ORAL | Status: AC
  Administered 2013-06-28: 600 mg via ORAL
  Filled 2013-06-28: qty 1

## 2013-06-28 NOTE — MAU Provider Note (Signed)
History     CSN: [id number]  Arrival date and time: 06/28/13 0449   First Provider Initiated Contact with Patient 06/28/13 [phone]      Chief Complaint  Patient presents with  . Possible Pregnancy  . Back Pain   HPI  Ms. [name] is a 37 y.o. female who presents with back pain that started approximately 2 days ago.  She took clomid this month for a desired pregnancy, she thought the back pain was related to being pregnant. She was due to start her period on Oct 24th and it did not come. She has an appointment with Dr. Elsie Stain office on Monday. She currently rates her lower abdominal pain 6/10; she has not tried anything for pain.  She has a history of hypertension, however is not currently taking her medication as prescribed.   OB History   Grav Para Term Preterm Abortions TAB SAB Ect Mult Living   3 1 1  2  2   1       Past Medical History  Diagnosis Date  . Hypertension   . Fibroid   . Ovarian cyst   . Complication of anesthesia     Slow to arouse after anesthesia    Past Surgical History  Procedure Laterality Date  . Appendectomy    . Dilation and curettage of uterus    . Ovarian cyst removal      Family History  Problem Relation Age of Onset  . Hypertension Mother   . Diabetes Father   . Cancer Neg Hx     History  Substance Use Topics  . Smoking status: Never Smoker   . Smokeless tobacco: Not on file  . Alcohol Use: Yes     Comment: occ    Allergies:  Allergies  Allergen Reactions  . Ciprofloxacin Shortness Of Breath, Diarrhea and Other (See Comments)    numbness  . Metronidazole Shortness Of Breath, Diarrhea and Other (See Comments)    numbness  . Toradol [Ketorolac Tromethamine] Anaphylaxis and Hives    Prescriptions prior to admission  Medication Sig Dispense Refill  . atenolol-chlorthalidone (TENORETIC) 50-25 MG per tablet Take 1 tablet by mouth daily.      . clomiPHENE (CLOMID) 50 MG tablet Take 50 mg by mouth daily.      Marland Kitchen  lisinopril (PRINIVIL,ZESTRIL) 10 MG tablet Take 10 mg by mouth daily.       . Prenatal Multivit-Min-Fe-FA (PRENATAL VITAMINS PO) Take by mouth.      Marland Kitchen acetaminophen (TYLENOL) 500 MG tablet Take 1,000 mg by mouth every 6 (six) hours as needed for pain.      . potassium chloride (K-DUR) 10 MEQ tablet Take 10 mEq by mouth 2 (two) times daily.      . traMADol (ULTRAM) 50 MG tablet Take 1 tablet (50 mg total) by mouth every 6 (six) hours as needed for pain.  15 tablet  0   Results for orders placed during the hospital encounter of 06/28/13 (from the past 24 hour(s))  URINALYSIS, ROUTINE W REFLEX MICROSCOPIC     Status: None   Collection Time    06/28/13  5:02 AM      Result Value Range   Color, Urine YELLOW  YELLOW   APPearance CLEAR  CLEAR   Specific Gravity, Urine 1.010  1.005 - 1.030   pH 5.5  5.0 - 8.0   Glucose, UA NEGATIVE  NEGATIVE mg/dL   Hgb urine dipstick NEGATIVE  NEGATIVE   Bilirubin Urine NEGATIVE  NEGATIVE   Ketones, ur NEGATIVE  NEGATIVE mg/dL   Protein, ur NEGATIVE  NEGATIVE mg/dL   Urobilinogen, UA 0.2  0.0 - 1.0 mg/dL   Nitrite NEGATIVE  NEGATIVE   Leukocytes, UA NEGATIVE  NEGATIVE  WET PREP, GENITAL     Status: Abnormal   Collection Time    06/28/13  5:54 AM      Result Value Range   Yeast Wet Prep HPF POC NONE SEEN  NONE SEEN   Trich, Wet Prep NONE SEEN  NONE SEEN   Clue Cells Wet Prep HPF POC NONE SEEN  NONE SEEN   WBC, Wet Prep HPF POC FEW (*) NONE SEEN  POCT PREGNANCY, URINE     Status: None   Collection Time    06/28/13  6:11 AM      Result Value Range   Preg Test, Ur NEGATIVE  NEGATIVE      Review of Systems  Constitutional: Negative for fever and chills.  Gastrointestinal: Positive for abdominal pain. Negative for nausea, vomiting, diarrhea and constipation.       + bilateral lower abdominal pain   Genitourinary: Negative for dysuria, urgency, frequency and hematuria.       No vaginal discharge. No vaginal bleeding. No dysuria.   Musculoskeletal:  Positive for back pain.  Neurological: Negative for headaches.   Physical Exam   Blood pressure 159/91, pulse 79, temperature 98.9 F (37.2 C), temperature source Oral, resp. rate 20, height 5\' 6"  (1.676 m), weight 127.914 kg (282 lb), last menstrual period 05/24/2013, SpO2 100.00%.  Physical Exam  Constitutional: She is oriented to person, place, and time. She appears well-developed and well-nourished. No distress.  HENT:  Head: Normocephalic.  Eyes: Pupils are equal, round, and reactive to light.  Neck: Normal range of motion.  Respiratory: Effort normal.  GI: Soft. She exhibits no distension. There is tenderness.  Suprapubic tenderness   Genitourinary: Vaginal discharge found.  Speculum exam: Vagina - Small amount of creamy discharge, no odor Cervix - No contact bleeding Bimanual exam: Cervix closed, mild CMT  Uterus non tender, normal size Adnexa non tender, no masses bilaterally GC/Chlam, wet prep done Chaperone present for exam.   Neurological: She is alert and oriented to person, place, and time.  Skin: Skin is warm. She is not diaphoretic.    MAU Course  Procedures None  MDM UA UPT- negative  Wet Prep GC/Chlamydia   Assessment and Plan  A: 1. Encounter for pregnancy test with result negative   2. Hypertension    P Discharge home Take you hypertension medication as prescribed  Keep your appointment with Dr. Elsie Stain office Repeat pregnancy test in a week in no menstrual cycle Ok to take ibuprofen as needed, as directed on the bottle.  Return to MAU as needed, if symptoms worsen.   Sally Reimers IRENE FNP-C  06/28/2013, 6:14 AM

## 2013-06-28 NOTE — MAU Note (Signed)
Pt reports L lower back pain and pain in lower stomach since yesterday. Pt reports some spotting since yesterday and mucusy discharge.

## 2013-06-28 NOTE — MAU Note (Signed)
Pt reports lower back pain x 24 hours, spotting. LMP 05/24/2013. On Clomid x one cycle

## 2013-06-29 LAB — GC/CHLAMYDIA PROBE AMP
CT Probe RNA: NEGATIVE
GC Probe RNA: NEGATIVE

## 2013-10-04 ENCOUNTER — Encounter (HOSPITAL_BASED_OUTPATIENT_CLINIC_OR_DEPARTMENT_OTHER): Payer: Self-pay | Admitting: Emergency Medicine

## 2013-10-04 ENCOUNTER — Emergency Department (HOSPITAL_BASED_OUTPATIENT_CLINIC_OR_DEPARTMENT_OTHER)
Admission: EM | Admit: 2013-10-04 | Discharge: 2013-10-04 | Disposition: A | Discharge status: Home / Self Care | Payer: Medicaid Other | Attending: Emergency Medicine | Admitting: Emergency Medicine

## 2013-10-04 DIAGNOSIS — Z79899 Other long term (current) drug therapy: Secondary | ICD-10-CM | POA: Insufficient documentation

## 2013-10-04 DIAGNOSIS — J029 Acute pharyngitis, unspecified: Secondary | ICD-10-CM | POA: Insufficient documentation

## 2013-10-04 DIAGNOSIS — I10 Essential (primary) hypertension: Secondary | ICD-10-CM | POA: Insufficient documentation

## 2013-10-04 DIAGNOSIS — J40 Bronchitis, not specified as acute or chronic: Secondary | ICD-10-CM | POA: Insufficient documentation

## 2013-10-04 DIAGNOSIS — Z8742 Personal history of other diseases of the female genital tract: Secondary | ICD-10-CM | POA: Insufficient documentation

## 2013-10-04 MED ORDER — PREDNISONE 10 MG PO TABS: ORAL_TABLET | ORAL | Status: DC

## 2013-10-04 MED ORDER — PREDNISONE 10 MG PO TABS
60.0000 mg | ORAL_TABLET | Freq: Once | ORAL | Status: AC
  Administered 2013-10-04: 60 mg via ORAL
  Filled 2013-10-04 (×2): qty 1

## 2013-10-04 NOTE — ED Notes (Signed)
C/o cough, runny nose, sore throat x 3 days

## 2013-10-04 NOTE — ED Provider Notes (Signed)
CSN: [id number]     Arrival date & time 10/04/13  1834 History   First MD Initiated Contact with Patient 10/04/13 1912     Chief Complaint  Patient presents with  . URI   (Consider location/radiation/quality/duration/timing/severity/associated sxs/prior Treatment) Patient is a 38 y.o. female presenting with cough. The history is provided by the patient. No language interpreter was used.  Cough Cough characteristics:  Non-productive Severity:  Mild Duration:  3 days Context: not sick contacts, not smoke exposure and not with activity   Associated symptoms: rhinorrhea and sore throat   Associated symptoms: no chest pain, no rash, no shortness of breath and no wheezing    Pt is a 38 year old female who has had a cough for 3-4 days. She reports a subjective fever on day one and now is feeling some better. She reports mild generalized body aches with runny nose and a mild sore throat. She reports that she has been using an albuterol inhaler with some relief. She denies a history of asthma but says that when she gets a cold or bronchitis it helps.   Past Medical History  Diagnosis Date  . Hypertension   . Fibroid   . Ovarian cyst   . Complication of anesthesia     Slow to arouse after anesthesia   Past Surgical History  Procedure Laterality Date  . Appendectomy    . Dilation and curettage of uterus    . Ovarian cyst removal     Family History  Problem Relation Age of Onset  . Hypertension Mother   . Diabetes Father   . Cancer Neg Hx    History  Substance Use Topics  . Smoking status: Never Smoker   . Smokeless tobacco: Not on file  . Alcohol Use: Yes     Comment: occ   OB History   Grav Para Term Preterm Abortions TAB SAB Ect Mult Living   3 1 1  2  2   1      Review of Systems  HENT: Positive for rhinorrhea and sore throat.   Respiratory: Positive for cough. Negative for shortness of breath and wheezing.   Cardiovascular: Negative for chest pain.  Gastrointestinal:  Negative for nausea, abdominal pain and diarrhea.  Musculoskeletal: Negative for back pain, neck pain and neck stiffness.  Skin: Negative for rash.  All other systems reviewed and are negative.    Allergies  Ciprofloxacin; Metronidazole; and Toradol  Home Medications   Current Outpatient Rx  Name  Route  Sig  Dispense  Refill  . acetaminophen (TYLENOL) 500 MG tablet   Oral   Take 1,000 mg by mouth every 6 (six) hours as needed for pain.         Marland Kitchen atenolol-chlorthalidone (TENORETIC) 50-25 MG per tablet   Oral   Take 1 tablet by mouth daily.         . clomiPHENE (CLOMID) 50 MG tablet   Oral   Take 50 mg by mouth daily.         Marland Kitchen lisinopril (PRINIVIL,ZESTRIL) 10 MG tablet   Oral   Take 10 mg by mouth daily.          . potassium chloride (K-DUR) 10 MEQ tablet   Oral   Take 10 mEq by mouth 2 (two) times daily.         . Prenatal Multivit-Min-Fe-FA (PRENATAL VITAMINS PO)   Oral   Take by mouth.         . traMADol Veatrice Bourbon)  50 MG tablet   Oral   Take 1 tablet (50 mg total) by mouth every 6 (six) hours as needed for pain.   15 tablet   0    BP 158/86  Pulse 98  Temp(Src) 98.9 F (37.2 C) (Oral)  Resp 20  SpO2 100%  LMP 10/03/2013 Physical Exam  Nursing note and vitals reviewed. Constitutional: She is oriented to person, place, and time. She appears well-developed and well-nourished. No distress.  HENT:  Head: Normocephalic and atraumatic.  Eyes: Conjunctivae and EOM are normal.  Neck: Normal range of motion.  Cardiovascular: Normal rate, regular rhythm and normal heart sounds.   Pulmonary/Chest: Effort normal and breath sounds normal. No respiratory distress. She has no wheezes.  Abdominal: Soft. Bowel sounds are normal.  Musculoskeletal: Normal range of motion.  Neurological: She is alert and oriented to person, place, and time.  Skin: Skin is warm and dry.  Psychiatric: She has a normal mood and affect. Her behavior is normal. Judgment and thought  content normal.    ED Course  Procedures (including critical care time) Labs Review Labs Reviewed - No data to display Imaging Review No results found.  EKG Interpretation   None       MDM   1. Bronchitis    Afebrile at today's visit. Reassuring exam. Pt reports an irritated cough. She also reports that when she has a cough or cold it is helpful for her to use albuterol. No distress, tachypnea, wheeing or hypoxia. Probably viral bronchitis. Discussed plan of care. Prednisone prescription given and recommended use of albuterol. Increase oral fluids and rest. Return precautions given.        Elisha Headland, NP 10/13/13 [phone]

## 2013-10-04 NOTE — Discharge Instructions (Signed)
Bronchitis Bronchitis is inflammation of the airways that extend from the windpipe into the lungs (bronchi). The inflammation often causes mucus to develop, which leads to a cough. If the inflammation becomes severe, it may cause shortness of breath. CAUSES  Bronchitis may be caused by:   Viral infections.   Bacteria.   Cigarette smoke.   Allergens, pollutants, and other irritants.  SIGNS AND SYMPTOMS  The most common symptom of bronchitis is a frequent cough that produces mucus. Other symptoms include:  Fever.   Body aches.   Chest congestion.   Chills.   Shortness of breath.   Sore throat.  DIAGNOSIS  Bronchitis is usually diagnosed through a medical history and physical exam. Tests, such as chest X-rays, are sometimes done to rule out other conditions.  TREATMENT  You may need to avoid contact with whatever caused the problem (smoking, for example). Medicines are sometimes needed. These may include:  Antibiotics. These may be prescribed if the condition is caused by bacteria.  Cough suppressants. These may be prescribed for relief of cough symptoms.   Inhaled medicines. These may be prescribed to help open your airways and make it easier for you to breathe.   Steroid medicines. These may be prescribed for those with recurrent (chronic) bronchitis. HOME CARE INSTRUCTIONS  Get plenty of rest.   Drink enough fluids to keep your urine clear or pale yellow (unless you have a medical condition that requires fluid restriction). Increasing fluids may help thin your secretions and will prevent dehydration.   Only take over-the-counter or prescription medicines as directed by your health care provider.  Only take antibiotics as directed. Make sure you finish them even if you start to feel better.  Avoid secondhand smoke, irritating chemicals, and strong fumes. These will make bronchitis worse. If you are a smoker, quit smoking. Consider using nicotine gum or  skin patches to help control withdrawal symptoms. Quitting smoking will help your lungs heal faster.   Put a cool-mist humidifier in your bedroom at night to moisten the air. This may help loosen mucus. Change the water in the humidifier daily. You can also run the hot water in your shower and sit in the bathroom with the door closed for 5 10 minutes.   Follow up with your health care provider as directed.   Wash your hands frequently to avoid catching bronchitis again or spreading an infection to others.  SEEK MEDICAL CARE IF: Your symptoms do not improve after 1 week of treatment.  SEEK IMMEDIATE MEDICAL CARE IF:  Your fever increases.  You have chills.   You have chest pain.   You have worsening shortness of breath.   You have bloody sputum.  You faint.  You have lightheadedness.  You have a severe headache.   You vomit repeatedly. MAKE SURE YOU:   Understand these instructions.  Will watch your condition.  Will get help right away if you are not doing well or get worse. Document Released: 08/15/2005 Document Revised: 06/05/2013 Document Reviewed: 04/09/2013 [facility] Patient Information 2014 [employer].   Take prednisone as prescribed Use albuterol inhaler as needed every 4-6 hours

## 2013-10-17 NOTE — ED Provider Notes (Signed)
Medical screening examination/treatment/procedure(s) were performed by non-physician practitioner and as supervising physician I was immediately available for consultation/collaboration.  EKG Interpretation   None         Neta Ehlers, MD 10/17/13 2217

## 2014-03-12 ENCOUNTER — Other Ambulatory Visit: Payer: Self-pay | Admitting: Radiology

## 2014-05-05 ENCOUNTER — Encounter (HOSPITAL_COMMUNITY): Payer: Self-pay | Admitting: Emergency Medicine

## 2014-05-05 ENCOUNTER — Emergency Department (HOSPITAL_COMMUNITY): Payer: Medicaid Other

## 2014-05-05 ENCOUNTER — Emergency Department (HOSPITAL_COMMUNITY)
Admission: EM | Admit: 2014-05-05 | Discharge: 2014-05-05 | Disposition: A | Discharge status: Home / Self Care | Payer: Medicaid Other | Attending: Emergency Medicine | Admitting: Emergency Medicine

## 2014-05-05 DIAGNOSIS — J45901 Unspecified asthma with (acute) exacerbation: Secondary | ICD-10-CM | POA: Insufficient documentation

## 2014-05-05 DIAGNOSIS — J680 Bronchitis and pneumonitis due to chemicals, gases, fumes and vapors: Secondary | ICD-10-CM

## 2014-05-05 DIAGNOSIS — Z8742 Personal history of other diseases of the female genital tract: Secondary | ICD-10-CM | POA: Diagnosis not present

## 2014-05-05 DIAGNOSIS — R079 Chest pain, unspecified: Secondary | ICD-10-CM | POA: Diagnosis present

## 2014-05-05 DIAGNOSIS — Z3202 Encounter for pregnancy test, result negative: Secondary | ICD-10-CM | POA: Diagnosis not present

## 2014-05-05 DIAGNOSIS — I10 Essential (primary) hypertension: Secondary | ICD-10-CM | POA: Insufficient documentation

## 2014-05-05 DIAGNOSIS — Z79899 Other long term (current) drug therapy: Secondary | ICD-10-CM | POA: Diagnosis not present

## 2014-05-05 HISTORY — DX: Unspecified asthma, uncomplicated: J45.909

## 2014-05-05 LAB — I-STAT TROPONIN, ED: TROPONIN I, POC: 0 ng/mL (ref 0.00–0.08)

## 2014-05-05 LAB — BASIC METABOLIC PANEL
ANION GAP: 14 (ref 5–15)
BUN: 13 mg/dL (ref 6–23)
CHLORIDE: 100 meq/L (ref 96–112)
CO2: 28 meq/L (ref 19–32)
CREATININE: 1.01 mg/dL (ref 0.50–1.10)
Calcium: 9.7 mg/dL (ref 8.4–10.5)
GFR calc Af Amer: 82 mL/min — ABNORMAL LOW (ref >90)
GFR calc non Af Amer: 71 mL/min — ABNORMAL LOW (ref >90)
Glucose, Bld: 105 mg/dL — ABNORMAL HIGH (ref 70–99)
Potassium: 3 mEq/L — ABNORMAL LOW (ref 3.7–5.3)
Sodium: 142 mEq/L (ref 137–147)

## 2014-05-05 LAB — CBC
HEMATOCRIT: 35 % — AB (ref 36.0–46.0)
Hemoglobin: 11.4 g/dL — ABNORMAL LOW (ref 12.0–15.0)
MCH: 27.5 pg (ref 26.0–34.0)
MCHC: 32.6 g/dL (ref 30.0–36.0)
MCV: 84.3 fL (ref 78.0–100.0)
PLATELETS: 360 10*3/uL (ref 150–400)
RBC: 4.15 MIL/uL (ref 3.87–5.11)
RDW: 15.1 % (ref 11.5–15.5)
WBC: 5.8 10*3/uL (ref 4.0–10.5)

## 2014-05-05 LAB — POC URINE PREG, ED: PREG TEST UR: NEGATIVE

## 2014-05-05 LAB — PRO B NATRIURETIC PEPTIDE: Pro B Natriuretic peptide (BNP): 32 pg/mL (ref 0–125)

## 2014-05-05 MED ORDER — PREDNISONE 50 MG PO TABS: 50.0000 mg | ORAL_TABLET | Freq: Every day | ORAL | Status: DC

## 2014-05-05 MED ORDER — ALBUTEROL SULFATE HFA 108 (90 BASE) MCG/ACT IN AERS: 1.0000 | INHALATION_SPRAY | Freq: Four times a day (QID) | RESPIRATORY_TRACT | Status: AC | PRN

## 2014-05-05 NOTE — ED Notes (Signed)
Pt ret from radiology at this time. Denies needs/complaints.  Resting on stretcher.  NAD.

## 2014-05-05 NOTE — ED Provider Notes (Signed)
CSN: [id number]     Arrival date & time 05/05/14  1430 History   First MD Initiated Contact with Patient 05/05/14 1613     Chief Complaint  Patient presents with  . Chest Pain  . Arm Pain    left     (Consider location/radiation/quality/duration/timing/severity/associated sxs/prior Treatment) Patient is a 38 y.o. female presenting with chest pain.  Chest Pain Pain location:  Substernal area Pain quality: pressure   Pain radiates to:  Does not radiate Pain radiates to the back: no   Pain severity:  Moderate Onset quality:  Gradual Duration:  3 days Timing:  Constant Progression:  Unchanged Chronicity:  New Context: breathing   Relieved by:  Nothing Worsened by:  Nothing tried Ineffective treatments:  None tried Associated symptoms: cough and shortness of breath   Associated symptoms: no abdominal pain, no back pain, no dizziness, no fever, no lower extremity edema, no nausea, no numbness and not vomiting     Past Medical History  Diagnosis Date  . Hypertension   . Fibroid   . Ovarian cyst   . Complication of anesthesia     Slow to arouse after anesthesia  . Asthma    Past Surgical History  Procedure Laterality Date  . Appendectomy    . Dilation and curettage of uterus    . Ovarian cyst removal     Family History  Problem Relation Age of Onset  . Hypertension Mother   . Diabetes Father   . Cancer Neg Hx    History  Substance Use Topics  . Smoking status: Never Smoker   . Smokeless tobacco: Not on file  . Alcohol Use: Yes     Comment: occ   OB History   Grav Para Term Preterm Abortions TAB SAB Ect Mult Living   3 1 1  2  2   1      Review of Systems  Constitutional: Negative for fever and chills.  HENT: Negative for congestion, rhinorrhea and sore throat.   Eyes: Negative for photophobia and visual disturbance.  Respiratory: Positive for cough and shortness of breath.   Cardiovascular: Positive for chest pain. Negative for leg swelling.   Gastrointestinal: Negative for nausea, vomiting, abdominal pain, diarrhea and constipation.  Endocrine: Negative for polyphagia and polyuria.  Genitourinary: Negative for dysuria, flank pain, vaginal bleeding, vaginal discharge and enuresis.  Musculoskeletal: Negative for back pain and gait problem.  Skin: Negative for color change and rash.  Neurological: Negative for dizziness, syncope, light-headedness and numbness.  Hematological: Negative for adenopathy. Does not bruise/bleed easily.  All other systems reviewed and are negative.     Allergies  Ciprofloxacin; Metronidazole; and Toradol  Home Medications   Prior to Admission medications   Medication Sig Start Date End Date Taking? Authorizing Provider  acetaminophen (TYLENOL) 500 MG tablet Take 1,000 mg by mouth every 6 (six) hours as needed for pain.   Yes Historical Provider, MD  albuterol (PROVENTIL HFA;VENTOLIN HFA) 108 (90 BASE) MCG/ACT inhaler Inhale 2 puffs into the lungs every 4 (four) hours as needed for wheezing or shortness of breath.   Yes Historical Provider, MD  atenolol-chlorthalidone (TENORETIC) 50-25 MG per tablet Take 1 tablet by mouth daily.   Yes Historical Provider, MD  lisinopril (PRINIVIL,ZESTRIL) 20 MG tablet Take 20 mg by mouth daily.   Yes Historical Provider, MD  naproxen sodium (ANAPROX) 220 MG tablet Take 660 mg by mouth every 4 (four) hours as needed (menstrual pain.).   Yes Historical Provider, MD  potassium chloride (K-DUR) 10 MEQ tablet Take 10 mEq by mouth 2 (two) times daily.   Yes Historical Provider, MD  Prenatal Multivit-Min-Fe-FA (PRENATAL VITAMINS PO) Take by mouth.   Yes Historical Provider, MD  albuterol (PROVENTIL HFA;VENTOLIN HFA) 108 (90 BASE) MCG/ACT inhaler Inhale 1-2 puffs into the lungs every 6 (six) hours as needed for wheezing or shortness of breath. 05/05/14   Debby Freiberg, MD  predniSONE (DELTASONE) 50 MG tablet Take 1 tablet (50 mg total) by mouth daily. 05/05/14   Debby Freiberg, MD    BP 126/72  Pulse 64  Temp(Src) 98.5 F (36.9 C) (Oral)  Resp 19  SpO2 100%  LMP 05/05/2014 Physical Exam  Vitals reviewed. Constitutional: She is oriented to person, place, and time. She appears well-developed and well-nourished.  HENT:  Head: Normocephalic and atraumatic.  Right Ear: External ear normal.  Left Ear: External ear normal.  Eyes: Conjunctivae and EOM are normal. Pupils are equal, round, and reactive to light.  Neck: Normal range of motion. Neck supple.  Cardiovascular: Normal rate, regular rhythm, normal heart sounds and intact distal pulses.   Pulmonary/Chest: Effort normal and breath sounds normal.  Abdominal: Soft. Bowel sounds are normal. There is no tenderness.  Musculoskeletal: Normal range of motion.  Neurological: She is alert and oriented to person, place, and time.  Skin: Skin is warm and dry.    ED Course  Procedures (including critical care time) Labs Review Labs Reviewed  CBC - Abnormal; Notable for the following:    Hemoglobin 11.4 (*)    HCT 35.0 (*)    All other components within normal limits  BASIC METABOLIC PANEL - Abnormal; Notable for the following:    Potassium 3.0 (*)    Glucose, Bld 105 (*)    GFR calc non Af Amer 71 (*)    GFR calc Af Amer 82 (*)    All other components within normal limits  PRO B NATRIURETIC PEPTIDE  I-STAT TROPOININ, ED  POC URINE PREG, ED    Imaging Review Dg Chest 2 View  05/05/2014   CLINICAL DATA:  Shortness of breath, chest pain  EXAM: CHEST  2 VIEW  COMPARISON:  01/14/2009.  FINDINGS: The heart size and mediastinal contours are within normal limits. Both lungs are clear. The visualized skeletal structures are unremarkable.  IMPRESSION: No active cardiopulmonary disease.   Electronically Signed   By: Conchita Paris M.D.   On: 05/05/2014 17:47     EKG Interpretation   Date/Time:  Monday May 05 2014 15:11:31 EDT Ventricular Rate:  74 PR Interval:  185 QRS Duration: 87 QT Interval:  422 QTC  Calculation: 468 R Axis:   16 Text Interpretation:  Sinus rhythm Low voltage, precordial leads No  significant change was found Confirmed by Debby Freiberg [phone]) on  05/05/2014 4:21:35 PM      MDM   Final diagnoses:  Bronchitis due to fumes or vapors, acute    38 y.o. female  with pertinent PMH of bronchitis presents with dyspnea and chest tightness x3 days in context of irritation from mixing bleach and another cleaner.  Patient was cleaning her apartment and mixed the two chemicals.  Since that time she has had shortness of breath and a chest tightness which occurs intermittently. She states that she has not had any nausea, vomiting, other infectious symptoms. Symptoms are intermittent and last a short period of time.  On arrival today vitals signs and physical exam as above were unremarkable. The patient's EKG was also  unremarkable. She did have a family history of coronary disease but age 59, however has had pain for 3 days and today has a negative troponin. Her history is extremely unlikely for ACS. She has no history of clot and is pertinent negatives. I suspect this is likely due to an irritant bronchitis.  As she is not hypoxic, has a clear cxr, and unremarkable labs, feel her stable to dc home with pcp fu.      Labs and imaging as above reviewed.   1. Bronchitis due to fumes or vapors, acute         Debby Freiberg, MD 05/05/14 [phone]

## 2014-05-05 NOTE — Discharge Instructions (Signed)
Acute Bronchitis Bronchitis is inflammation of the airways that extend from the windpipe into the lungs (bronchi). The inflammation often causes mucus to develop. This leads to a cough, which is the most common symptom of bronchitis.  In acute bronchitis, the condition usually develops suddenly and goes away over time, usually in a couple weeks. Smoking, allergies, and asthma can make bronchitis worse. Repeated episodes of bronchitis may cause further lung problems.  CAUSES Acute bronchitis is most often caused by the same virus that causes a cold. The virus can spread from person to person (contagious) through coughing, sneezing, and touching contaminated objects. SIGNS AND SYMPTOMS   Cough.   Fever.   Coughing up mucus.   Body aches.   Chest congestion.   Chills.   Shortness of breath.   Sore throat.  DIAGNOSIS  Acute bronchitis is usually diagnosed through a physical exam. Your health care provider will also ask you questions about your medical history. Tests, such as chest X-rays, are sometimes done to rule out other conditions.  TREATMENT  Acute bronchitis usually goes away in a couple weeks. Oftentimes, no medical treatment is necessary. Medicines are sometimes given for relief of fever or cough. Antibiotic medicines are usually not needed but may be prescribed in certain situations. In some cases, an inhaler may be recommended to help reduce shortness of breath and control the cough. A cool mist vaporizer may also be used to help thin bronchial secretions and make it easier to clear the chest.  HOME CARE INSTRUCTIONS  Get plenty of rest.   Drink enough fluids to keep your urine clear or pale yellow (unless you have a medical condition that requires fluid restriction). Increasing fluids may help thin your respiratory secretions (sputum) and reduce chest congestion, and it will prevent dehydration.   Take medicines only as directed by your health care provider.  If  you were prescribed an antibiotic medicine, finish it all even if you start to feel better.  Avoid smoking and secondhand smoke. Exposure to cigarette smoke or irritating chemicals will make bronchitis worse. If you are a smoker, consider using nicotine gum or skin patches to help control withdrawal symptoms. Quitting smoking will help your lungs heal faster.   Reduce the chances of another bout of acute bronchitis by washing your hands frequently, avoiding people with cold symptoms, and trying not to touch your hands to your mouth, nose, or eyes.   Keep all follow-up visits as directed by your health care provider.  SEEK MEDICAL CARE IF: Your symptoms do not improve after 1 week of treatment.  SEEK IMMEDIATE MEDICAL CARE IF:  You develop an increased fever or chills.   You have chest pain.   You have severe shortness of breath.  You have bloody sputum.   You develop dehydration.  You faint or repeatedly feel like you are going to pass out.  You develop repeated vomiting.  You develop a severe headache. MAKE SURE YOU:   Understand these instructions.  Will watch your condition.  Will get help right away if you are not doing well or get worse. Document Released: 09/22/2004 Document Revised: 12/30/2013 Document Reviewed: 02/05/2013 [facility] Patient Information 2015 [place], [place]. This information is not intended to replace advice given to you by your health care provider. Make sure you discuss any questions you have with your health care provider.  Pneumonitis Pneumonitis is inflammation of the lungs.  CAUSES  Many things can cause pneumonitis. These can include:   A bacterial or  viral infection. Pneumonitis due to an infection is usually called pneumonia.  Work-related exposures, including farm and industrial work. Some substances that can cause pneumonitis include asbestos, silica, inhaled acids, or inhaled chlorine gas.   Repeated exposure to bird feathers, bird  feces, or other allergens.   Medicine such as chemotherapy drugs, certain antibiotics, and some heart medicines.   Radiation therapy.   Exposure to mold. A hot tub, sauna, or home humidifier can have mold growing in it, even if it looks clean. The mold can be breathed in through water vapor.  Breathing (aspirating) stomach contents, food, or liquids into the lungs.  SIGNS AND SYMPTOMS   Cough.   Shortness of breath or difficulty breathing.   Fever.   Decreased energy.   Decreased appetite.  DIAGNOSIS  To diagnose pneumonitis, your health care provider will do a complete history and physical exam. Various tests may be ordered, such as:   Pulmonary function test.   Chest X-ray.   CT scan of the lungs.   Bronchoscopy.   Lung biopsy.  TREATMENT  Treatment will depend on the cause of the pneumonitis. If the cause is exposure to a substance, avoiding further exposure to that substance will help reduce your symptoms. Possible medical treatments for pneumonitis include:   Corticosteroid medicine to help decrease inflammation in the lungs.   Antibiotic medicine to help fight a bacterial lung infection.   Oxygen therapy if you are having difficulty breathing.  HOME CARE INSTRUCTIONS   Avoid exposure to any substance identified as the cause of your pneumonitis.   If you must continue to work with substances that can cause pneumonitis, wear a mask to protect your lungs.   Only take over-the-counter or prescription medicine as directed by your health care provider.   Do not smoke.   If you use inhalers, keep them with you at all times.   Follow up with your health care provider as directed.  SEEK IMMEDIATE MEDICAL CARE IF:   You develop new or increased shortness of breath.   You develop a blue color (cyanosis) under your fingernails.   You have a fever.  MAKE SURE YOU:   Understand these instructions.  Will watch your condition.  Will  get help right away if you are not doing well or get worse. Document Released: 02/02/2010 Document Revised: 04/17/2013 Document Reviewed: 02/04/2013 [facility] Patient Information 2015 [place], [place]. This information is not intended to replace advice given to you by your health care provider. Make sure you discuss any questions you have with your health care provider.   Emergency Department Resource Guide 1) Find a Doctor and Pay Out of Pocket Although you won't have to find out who is covered by your insurance plan, it is a good idea to ask around and get recommendations. You will then need to call the office and see if the doctor you have chosen will accept you as a new patient and what types of options they offer for patients who are self-pay. Some doctors offer discounts or will set up payment plans for their patients who do not have insurance, but you will need to ask so you aren't surprised when you get to your appointment.  2) Contact Your Local Health Department Not all health departments have doctors that can see patients for sick visits, but many do, so it is worth a call to see if yours does. If you don't know where your local health department is, you can check in your phone book.  The CDC also has a tool to help you locate your state's health department, and many state websites also have listings of all of their local health departments.  3) Find a [place] [facility] If your illness is not likely to be very severe or complicated, you may want to try a walk in clinic. These are popping up all over the country in pharmacies, drugstores, and shopping centers. They're usually staffed by nurse practitioners or physician assistants that have been trained to treat common illnesses and complaints. They're usually fairly quick and inexpensive. However, if you have serious medical issues or chronic medical problems, these are probably not your best option.  No Primary Care Doctor: - Call Health Connect  at  [phone] - they can help you locate a primary care doctor that  accepts your insurance, provides certain services, etc. - Physician Referral Service- [phone]  Chronic Pain Problems: Organization         Address  Phone   Notes  [facility]  [phone] Patients need to be referred by their primary care doctor.   Medication Assistance: Organization         Address  Phone   Notes  [facility] Medication [facility] [address] [place], [place] [address] [phone] --Must be a resident of [facility] -- Must have NO insurance coverage whatsoever (no Medicaid/ Medicare, etc.) -- The pt. MUST have a primary care doctor that directs their care regularly and follows them in the community   MedAssist  [phone]   [employer]  [phone]    Agencies that provide inexpensive medical care: Organization         Address  Phone   Notes  [place]  [phone]   [facility]    [phone]   [facility] [place], [place] [address] [phone]   [facility] [address] [place] [phone]   Planned Parenthood    [phone]   [facility]    [phone]   [place] and [place] [address], [place] Phone:  [phone], Fax:  [phone] Hours of Operation:  9 am - 6 pm, M-F.  Also accepts Medicaid/Medicare and self-pay.  [facility] for [place], [address], [place] Phone: [phone], Fax: [phone]. Hours of Operation:  8:30 am - 5:30 pm, M-F.  Also accepts Medicaid and self-pay.  [facility] [place] [address] [place] Phone: [phone]   [place], [place], [place] [phone], Ext. [phone] Mondays & Thursdays: 7-9 AM.  First 15 patients are seen on a first come, first serve basis.     [place] Providers:  Organization         Address  Phone   Notes  [facility] [address], [place] [phone] Also accepts self-pay patients.  [facility] [address] [place], [place]  [phone]   [place], [address], [place] [phone]   [facility] [address] [place] [phone]   Lucianne Lei [address], [place]   [phone] Only accepts [place] Access [place] patients after they have their name applied to their card.   Self-Pay (  no insurance) in [facility]:  Organization         Address  Phone   Notes  Sickle Cell Patients, [place] [phone]   [facility] [place] [phone]   [facility] [place]  [place], [place], [place] [phone]   [facility]/Dr. Osei-Bonsu  [address] [place] or [address], [place] [phone] Phone number for both [place] and [place] locations is the same.  Urgent Medical and [facility] [address], [place] [phone]   [facility] [address] [place] or [address] [phone]   [facility] [address], [place] [phone], phone; [phone], fax Sees patients 1st and 3rd Saturday of every month.  Must not qualify for public or private insurance (i.e. Medicaid, Medicare, [place] Health Choice, Veterans' Benefits)  Household income should be no more than 200% of the poverty level The clinic cannot treat you if you are pregnant or think you are pregnant  Sexually transmitted diseases are not treated at the clinic.    Dental Care: Organization         Address  Phone  Notes  [facility]  Department of [facility] [place] [phone] Accepts children up to age 63 who are enrolled in [place] or [place]; pregnant women with a Medicaid card; and children who have applied for Medicaid or [place] Health Choice, but were declined, whose parents can pay a reduced fee at time of service.  [facility] [place] Department of [facility]  [address], [place] [phone] Accepts children up to age 36 who are enrolled in [place] or [place]; pregnant women with a Medicaid card; and children who have applied for Medicaid or [place] Health Choice, but were declined, whose parents can pay a reduced fee at time of service.  [place] Adult Dental Access PROGRAM  [place] [phone] Patients are seen by appointment only. Walk-ins are not accepted. [place] will see patients 53 years of age and older. Monday - Tuesday (8am-5pm) Most Wednesdays (8:30-5pm) $30 per visit, cash only  [facility] Adult Dental Access PROGRAM  [address], [facility] [phone] Patients are seen by appointment only. Walk-ins are not accepted. [place] will see patients 80 years of age and older. One Wednesday Evening (Monthly: Volunteer Based).  $30 per visit, cash only  [place]  [phone] for adults; Children under age 70, call Graduate Pediatric Dentistry at [phone]. Children aged 27-14, please call [phone] to request a pediatric application.  Dental services are provided in all areas of dental care including fillings, crowns and bridges, complete and partial dentures, implants, gum treatment, root canals, and extractions. Preventive care is also provided. Treatment is provided to both adults and children. Patients are selected via a lottery and there is often a waiting list.   [facility] [address], [place]  [phone]  [email or web address]   [facility] [address] [place], [place] [phone], Ext. [phone] Second and Fourth Thursday of each month, opens at 6:30 AM; Clinic ends at 9 AM.  Patients are seen on a first-come first-served basis, and a limited number are  seen during each clinic.   [facility]  [address] Hillard Danker [place], [place] [phone]   Eligibility Requirements You must have lived in [place], [place], or [place] counties for at least the last three months.   You cannot be eligible for state or federal sponsored [employer], including [employer], [place], or [employer].   You generally cannot be eligible for healthcare insurance through your employer.    How to apply: Eligibility screenings are held every Tuesday and Wednesday afternoon from 1:00 pm until 4:00 pm. You do not need an appointment for the interview!  [facility] [address] [place], [place]   [place]  [place] Department  [place]  [phone]    Behavioral Health Resources in the Community: Intensive Outpatient Programs Organization         Address  Phone  Notes  [facility] [place]. [address], [place], [place] [phone]   [facility] Outpatient [address] [place], [place]   ADS: Alcohol & Drug S[facility] [address] [place], [place]   [place] [address]  [place], [place] or [phone]   Substance Abuse Resources Organization         Address  Phone  Notes  Alcohol and Drug Services  [phone]   [place]  [phone]   [facility] [place]   Chinita Pester  [phone]   Residential & Outpatient Substance Abuse Program  [phone]   Psychological Services Organization          Address  Phone  Notes  [facility] [place]  [place]  [phone]   [place] [address], [place] or [phone]    Mobile Crisis Teams Organization         Address  Phone  Notes  [facility], Mobile Crisis Care Unit  [phone]   [facility]  [address] [place], [place]   [place] Levels [address], [place] [phone]    Self-Help/Support Groups Organization         Address  Phone             Notes  [place]. of [place] - variety of support groups  [place] Call for more information  Narcotics Anonymous (NA), [facility] [address], [employer] [place]  2 meetings at this location   Special educational needs teacher         Address  Phone  Notes  [facility]idential [facility] [place],    [place]  [phone]   [facility]  [address] [place] [address], [place], [place]   [place], [place] [phone] Admissions: 8am-3pm M-F  Incentives Substance [place] [address]    [place], [place] [phone]   [facility] [address] Jadene Pierini [place], [place]   [facility] [address]  [place], [place]   [facility] Programs - Intensive Outpatient [address] Kristeen [address], [place], [place]   [facility] ([place].,  [place], [place] or [phone]   [facility] Services (RTS) [address], [place], [place] Accepts Medicaid  [place] [address]  [place] [phone] Substance Abuse/Addiction Treatment   [facility] Organization         Address  Phone  Notes  [facility]  [phone]   Domenic Schwab, PhD [address] Arlis Porta [place], [place]   [phone] or [phone]   [place], [place] [phone]   [address], [place], [place] [phone] Insurance/Medicaid/sponsorship through [facility] and Families [address] [place]                                    [place], [place] [phone] [place] [address] [place] [phone]    Dr. Adele Schilder  [phone]   [facility] Dept. 1) [address] [place] 2) [place] 3)  [place] [address], [place] [phone]  [phone]   [place] [phone] or [phone] (After Hours)

## 2014-05-05 NOTE — ED Notes (Addendum)
Initial contact-pt c/o left arm pain that started last night in her left hand and radiated to her mid chest. No cardiac hx. Has familial cardiac hx. Feels SOB. Out of albuterol currently. Has a new neighbor that smokes constantly and patient has been exposed to this recently. Denies dizziness but did feel lightheadedness. Pt A&Ox4. Ambulatory and moving all extremities. Speaking full/clear sentences. Awaiting PA/MD.

## 2014-05-05 NOTE — ED Notes (Signed)
Initial Contact - pt A+Ox4, pt reports intermittent L hand pain radiating into arm and central chest x3 days, reports improved with position changes.  Pt reports SOB and lightheadedness.  Pt reports new neighbor moved in and is a smoker.  Skin PWD. MAEI, self repositioning for comfort.  Speaking full/clear sentences, rr even/un-lab, lsctab.  NAD.

## 2014-06-30 ENCOUNTER — Encounter (HOSPITAL_COMMUNITY): Payer: Self-pay | Admitting: Emergency Medicine

## 2014-07-15 LAB — CYTOLOGY - PAP: PAP SMEAR: NEGATIVE

## 2014-10-20 ENCOUNTER — Other Ambulatory Visit: Payer: Self-pay

## 2014-11-25 ENCOUNTER — Encounter (HOSPITAL_COMMUNITY): Payer: Self-pay | Admitting: Emergency Medicine

## 2014-11-25 ENCOUNTER — Emergency Department (HOSPITAL_COMMUNITY): Payer: Medicaid Other

## 2014-11-25 ENCOUNTER — Emergency Department (HOSPITAL_COMMUNITY)
Admission: EM | Admit: 2014-11-25 | Discharge: 2014-11-25 | Disposition: A | Discharge status: Home / Self Care | Payer: Medicaid Other | Attending: Emergency Medicine | Admitting: Emergency Medicine

## 2014-11-25 DIAGNOSIS — J45909 Unspecified asthma, uncomplicated: Secondary | ICD-10-CM | POA: Diagnosis not present

## 2014-11-25 DIAGNOSIS — Z8742 Personal history of other diseases of the female genital tract: Secondary | ICD-10-CM | POA: Diagnosis not present

## 2014-11-25 DIAGNOSIS — S6991XA Unspecified injury of right wrist, hand and finger(s), initial encounter: Secondary | ICD-10-CM | POA: Diagnosis present

## 2014-11-25 DIAGNOSIS — Y9289 Other specified places as the place of occurrence of the external cause: Secondary | ICD-10-CM | POA: Insufficient documentation

## 2014-11-25 DIAGNOSIS — Z79899 Other long term (current) drug therapy: Secondary | ICD-10-CM | POA: Insufficient documentation

## 2014-11-25 DIAGNOSIS — W01198A Fall on same level from slipping, tripping and stumbling with subsequent striking against other object, initial encounter: Secondary | ICD-10-CM | POA: Diagnosis not present

## 2014-11-25 DIAGNOSIS — Z7952 Long term (current) use of systemic steroids: Secondary | ICD-10-CM | POA: Diagnosis not present

## 2014-11-25 DIAGNOSIS — S63501A Unspecified sprain of right wrist, initial encounter: Secondary | ICD-10-CM | POA: Insufficient documentation

## 2014-11-25 DIAGNOSIS — Y998 Other external cause status: Secondary | ICD-10-CM | POA: Diagnosis not present

## 2014-11-25 DIAGNOSIS — Y9389 Activity, other specified: Secondary | ICD-10-CM | POA: Insufficient documentation

## 2014-11-25 DIAGNOSIS — I10 Essential (primary) hypertension: Secondary | ICD-10-CM | POA: Diagnosis not present

## 2014-11-25 LAB — POC URINE PREG, ED: Preg Test, Ur: NEGATIVE

## 2014-11-25 MED ORDER — ACETAMINOPHEN 325 MG PO TABS
975.0000 mg | ORAL_TABLET | Freq: Once | ORAL | Status: AC
  Administered 2014-11-25: 975 mg via ORAL
  Filled 2014-11-25: qty 3

## 2014-11-25 NOTE — ED Notes (Signed)
Patient states she tripped over a child's toy and fell onto her right wrist, states she is currently unable to move it. Patient has not taken anything for pain.
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
## 2014-11-25 NOTE — Discharge Instructions (Signed)
Rest, Ice intermittently (in the first 24-48 hours), Gentle compression with an Ace wrap, and elevate (Limb above the level of the heart)   Take acetaminophen (Tylenol) up to 975 mg (this is normally 3 over-the-counter pills) up to 3 times a day. Do not drink alcohol. Make sure your other medications do not contain acetaminophen (Read the labels!)  Do not hesitate to return to the emergency room for any new, worsening or concerning symptoms.  Please obtain primary care using resource guide below. But the minute you were seen in the emergency room and that they will need to obtain records for further outpatient management.   Joint Sprain A sprain is a tear or stretch in the ligaments that hold a joint together. Severe sprains may need as long as 3-6 weeks of immobilization and/or exercises to heal completely. Sprained joints should be rested and protected. If not, they can become unstable and prone to re-injury. Proper treatment can reduce your pain, shorten the period of disability, and reduce the risk of repeated injuries. TREATMENT   Rest and elevate the injured joint to reduce pain and swelling.  Apply ice packs to the injury for 20-30 minutes every 2-3 hours for the next 2-3 days.  Keep the injury wrapped in a compression bandage or splint as long as the joint is painful or as instructed by your caregiver.  Do not use the injured joint until it is completely healed to prevent re-injury and chronic instability. Follow the instructions of your caregiver.  Long-term sprain management may require exercises and/or treatment by a physical therapist. Taping or special braces may help stabilize the joint until it is completely better. SEEK MEDICAL CARE IF:   You develop increased pain or swelling of the joint.  You develop increasing redness and warmth of the joint.  You develop a fever.  It becomes stiff.  Your hand or foot gets cold or numb. Document Released: 09/22/2004 Document  Revised: 11/07/2011 Document Reviewed: 09/01/2008 [facility] Patient Information 2015 [place], [place]. This information is not intended to replace advice given to you by your health care provider. Make sure you discuss any questions you have with your health care provider.   Emergency Department Resource Guide 1) Find a Doctor and Pay Out of Pocket Although you won't have to find out who is covered by your insurance plan, it is a good idea to ask around and get recommendations. You will then need to call the office and see if the doctor you have chosen will accept you as a new patient and what types of options they offer for patients who are self-pay. Some doctors offer discounts or will set up payment plans for their patients who do not have insurance, but you will need to ask so you aren't surprised when you get to your appointment.  2) Contact Your Local Health Department Not all health departments have doctors that can see patients for sick visits, but many do, so it is worth a call to see if yours does. If you don't know where your local health department is, you can check in your phone book. The CDC also has a tool to help you locate your state's health department, and many state websites also have listings of all of their local health departments.  3) Find a [facility] If your illness is not likely to be very severe or complicated, you may want to try a walk in clinic. These are popping up all over the country in pharmacies, drugstores, and  shopping centers. They're usually staffed by nurse practitioners or physician assistants that have been trained to treat common illnesses and complaints. They're usually fairly quick and inexpensive. However, if you have serious medical issues or chronic medical problems, these are probably not your best option.  No Primary Care Doctor: - Call Health Connect at  [phone] - they can help you locate a primary care doctor that  accepts your insurance,  provides certain services, etc. - Physician Referral Service- [phone]  Chronic Pain Problems: Organization         Address  Phone   Notes  [facility]  [phone] Patients need to be referred by their primary care doctor.   Medication Assistance: Organization         Address  Phone   Notes  [facility] Medication [facility] [address] [place], [place] [address] [phone] --Must be a resident of [facility] -- Must have NO insurance coverage whatsoever (no Medicaid/ Medicare, etc.) -- The pt. MUST have a primary care doctor that directs their care regularly and follows them in the community   MedAssist  [phone]   [employer]  [phone]    Agencies that provide inexpensive medical care: Organization         Address  Phone   Notes  [place]  [phone]   [facility]    [phone]   [facility] [place], [place] [address] [phone]   [place] [address] [place] [phone]   Planned Parenthood    [phone]   [facility]    [phone]   [place] and [address], [place] Phone:  [phone], Fax:  [phone] Hours of Operation:  9 am - 6 pm, M-F.  Also accepts Medicaid/Medicare and self-pay.  [facility] for [place], [address], [place] Phone: [phone], Fax: [phone]. Hours of Operation:  8:30 am - 5:30 pm, M-F.  Also accepts Medicaid and self-pay.  [facility] [address] [place] Phone: [phone]   [place], [place], [place] [phone], Ext. [phone] Mondays & Thursdays: 7-9 AM.  First 15 patients are seen on a first come, first serve basis.    [place] Providers:  Organization         Address  Phone    Notes  [facility] [address], [place] [phone] Also accepts self-pay patients.  [facility] [address], [place]  [phone]   [place], [address], [place] [phone]   [facility] [address], [place] [phone]   Lucianne Lei [address], [place]   [phone] Only accepts [place] Access [place] patients after they have their name applied to their card.   Self-Pay (no insurance) in [facility]:  Organization         Address  Phone   Notes  Sickle Cell Patients, [facility] Internal Medicine [place] [phone]   [facility] [place] [phone]   [facility] [place]  [place] [address] [place], [place] [phone]  [facility]/Dr. Osei-Bonsu  [address] [place] or [address], [place] [phone] Phone number for both [place] and [place] locations is the same.  Urgent Medical and [facility] [address] [place] [phone]   [facility] ([place]) [address], [place] or [address] [phone]   [facility] [address] [place] [phone], phone; [phone], fax Sees patients 1st and 3rd Saturday of every month.  Must not qualify for public or private insurance (i.e. Medicaid, Medicare, [place] Health Choice, Veterans' Benefits)  Household income should be no more than 200% of the poverty level The clinic cannot treat you if you are pregnant or think you are pregnant  Sexually transmitted diseases are not treated at the clinic.    Dental Care: Organization         Address  Phone  Notes  [facility] Department of [facility] [place] [phone] Accepts children up to age 81 who are enrolled in [place] or [place]; pregnant women with a Medicaid card; and children who have applied for Medicaid or [place] Health Choice, but were declined, whose parents can pay a reduced fee at time of service.  [facility] Department of [facility]  [address], [place] [phone] Accepts children up to age 55 who are enrolled in [place] or [place]; pregnant women with a Medicaid card; and children who have applied for Medicaid or [place] Health Choice, but were declined, whose parents can pay a reduced fee at time of service.  [place] Adult Dental Access PROGRAM  [place] [phone] Patients are seen by appointment only. Walk-ins are not accepted. [place] will see patients 60 years of age and older. Monday - Tuesday (8am-5pm) Most Wednesdays (8:30-5pm) $30 per visit, cash only  [facility] Adult Dental Access PROGRAM  [address], [facility] [phone] Patients are seen by appointment only. Walk-ins are not accepted. [place] will see patients 1 years of age and older. One Wednesday Evening (Monthly: Volunteer Based).  $30 per visit, cash only  [place]  [phone] for adults; Children under age 67, call Graduate Pediatric Dentistry at [phone]. Children aged 56-14, please call [phone] to request a pediatric application.  Dental services are provided in all areas of dental care including fillings, crowns and bridges, complete and partial dentures, implants, gum treatment, root canals, and extractions. Preventive care is also provided. Treatment is provided to both adults and children. Patients are selected via a lottery and there is often a waiting list.   [facility] [address] [place]  [phone] [email or web address]   [facility] [address] [employer], [place] [phone], Ext.  123 Second and Fourth Thursday of each month, opens at 6:30 AM; Clinic ends at 9 AM.  Patients are seen on a first-come first-served basis, and a limited number are seen during each clinic.   [facility]  [address] Hillard Danker [place], [place] [phone]   Eligibility Requirements You must have lived in [place], [place], or [place] counties for at least the last three months.   You cannot be eligible for state or federal sponsored [employer], including [employer], [place], or [employer].   You generally cannot be eligible for healthcare insurance  through your employer.    How to apply: Eligibility screenings are held every Tuesday and Wednesday afternoon from 1:00 pm until 4:00 pm. You do not need an appointment for the interview!  [facility] [address], [place], [place]   [place]  [place] Department  [place]  [phone]    Behavioral Health Resources in the Community: Intensive Outpatient Programs Organization         Address  Phone  Notes  [place]. [address], [place], [place] [phone]   [facility] [address] [place], [place]   ADS: Alcohol & Drug Svcs [address], [place], [place]   [place] [address],  [place], [place] or [phone]   Substance Abuse Resources Organization         Address  Phone  Notes  Alcohol and Drug Services  [phone]   [place]  [phone]   [facility] [place]   Chinita Pester  [phone]   Residential & Outpatient Substance Abuse Program  [phone]   Psychological Services Organization         Address  Phone  Notes  [facility] [place]  [place]  [phone]   [place] [address] [employer] or [phone]    Mobile Crisis Teams Organization         Address  Phone  Notes  [facility], Mobile Crisis Care Unit  [phone]   [facility]  [address] [place], [place]   [place] [address] [place] [phone]    Self-Help/Support Groups Organization         Address  Phone             Notes  [place]. of [place] - variety of support groups  [place] Call for more information  Narcotics Anonymous (NA), Caring Services [address], [employer] [place]  2 meetings at this location   Special educational needs teacher         Address  Phone  Notes  [facility] Residential Treatment [place],    [place]  [phone]   [facility]  [address] [place] [phone], [place], [place]   [place], [place] [phone] Admissions: 8am-3pm M-F  Incentives Substance [place] [address],    [place], [place] [phone]   [facility] [address] [place], [place], [place]   [facility] [address],  [place], [place]   [facility] Programs - Intensive Outpatient [address] Kristeen [address], [place], [place]   [facility] ([place].,  [place], [place] [phone] or [phone]   [facility]idential Treatment Services (RTS) [address] [place], [place] Accepts Medicaid  [facility] [place] [address]  [place] [phone] Substance Abuse/Addiction Treatment   [facility] Organization         Address  Phone  Notes  [facility]  [phone]   Domenic Schwab, PhD [address] [place], [place]   [phone] or [phone]   [employer] [place]  [place], [place] [phone]  [facility] [address] [place], [place] [phone] Insurance/Medicaid/sponsorship through [employer] and Families [address], [address] [place], [place] [phone] [place], [place] [phone]    Dr. Adele Schilder  [phone]   [facility] Dept. 1) [address] [place] 2) [place] 3)  [place] [address], [place] [phone]  [phone]   [place] [phone] or [phone] (After Hours)

## 2014-12-30 ENCOUNTER — Emergency Department (HOSPITAL_COMMUNITY)
Admission: EM | Admit: 2014-12-30 | Discharge: 2014-12-30 | Disposition: A | Discharge status: Home / Self Care | Payer: Medicaid Other | Attending: Emergency Medicine | Admitting: Emergency Medicine

## 2014-12-30 ENCOUNTER — Encounter (HOSPITAL_COMMUNITY): Payer: Self-pay | Admitting: Emergency Medicine

## 2014-12-30 DIAGNOSIS — Z79899 Other long term (current) drug therapy: Secondary | ICD-10-CM | POA: Diagnosis not present

## 2014-12-30 DIAGNOSIS — Z86018 Personal history of other benign neoplasm: Secondary | ICD-10-CM | POA: Insufficient documentation

## 2014-12-30 DIAGNOSIS — J45909 Unspecified asthma, uncomplicated: Secondary | ICD-10-CM | POA: Insufficient documentation

## 2014-12-30 DIAGNOSIS — Z7952 Long term (current) use of systemic steroids: Secondary | ICD-10-CM | POA: Insufficient documentation

## 2014-12-30 DIAGNOSIS — I10 Essential (primary) hypertension: Secondary | ICD-10-CM

## 2014-12-30 DIAGNOSIS — Z8742 Personal history of other diseases of the female genital tract: Secondary | ICD-10-CM | POA: Diagnosis not present

## 2014-12-30 DIAGNOSIS — E663 Overweight: Secondary | ICD-10-CM | POA: Diagnosis not present

## 2014-12-30 NOTE — ED Provider Notes (Signed)
CSN: [id number]     Arrival date & time 12/30/14  1930 History   First MD Initiated Contact with Patient 12/30/14 2018     Chief Complaint  Patient presents with  . Hypertension   HPI Patient presents to the emergency room for evaluation of hypertension. Patient used to be on different medications but she did not like the side effects so her doctor recently switched her to a new medication. The patient developed a headache tonight and started to feel lightheaded. She checked her blood pressure was elevated at 937 systolic. Patient became concerned and came to the emergency room for evaluation. Her symptoms have subsequently resolved. Not having any trouble with any headache. No chest pain. No numbness. No shortness of breath. Patient feels fine now Past Medical History  Diagnosis Date  . Hypertension   . Fibroid   . Ovarian cyst   . Complication of anesthesia     Slow to arouse after anesthesia  . Asthma    Past Surgical History  Procedure Laterality Date  . Appendectomy    . Dilation and curettage of uterus    . Ovarian cyst removal     Family History  Problem Relation Age of Onset  . Hypertension Mother   . Diabetes Father   . Cancer Neg Hx    History  Substance Use Topics  . Smoking status: Never Smoker   . Smokeless tobacco: Not on file  . Alcohol Use: Yes     Comment: occ   OB History    Gravida Para Term Preterm AB TAB SAB Ectopic Multiple Living   3 1 1  2  2   1      Review of Systems  All other systems reviewed and are negative.     Allergies  Ciprofloxacin; Metronidazole; and Toradol  Home Medications   Prior to Admission medications   Medication Sig Start Date End Date Taking? Authorizing Provider  acetaminophen (TYLENOL) 500 MG tablet Take 1,000 mg by mouth every 6 (six) hours as needed for pain.    Historical Provider, MD  albuterol (PROVENTIL HFA;VENTOLIN HFA) 108 (90 BASE) MCG/ACT inhaler Inhale 2 puffs into the lungs every 4 (four) hours as needed  for wheezing or shortness of breath.    Historical Provider, MD  albuterol (PROVENTIL HFA;VENTOLIN HFA) 108 (90 BASE) MCG/ACT inhaler Inhale 1-2 puffs into the lungs every 6 (six) hours as needed for wheezing or shortness of breath. 05/05/14   Debby Freiberg, MD  atenolol-chlorthalidone (TENORETIC) 50-25 MG per tablet Take 1 tablet by mouth daily.    Historical Provider, MD  lisinopril (PRINIVIL,ZESTRIL) 20 MG tablet Take 20 mg by mouth daily.    Historical Provider, MD  naproxen sodium (ANAPROX) 220 MG tablet Take 660 mg by mouth every 4 (four) hours as needed (menstrual pain.).    Historical Provider, MD  potassium chloride (K-DUR) 10 MEQ tablet Take 10 mEq by mouth 2 (two) times daily.    Historical Provider, MD  predniSONE (DELTASONE) 50 MG tablet Take 1 tablet (50 mg total) by mouth daily. 05/05/14   Debby Freiberg, MD  Prenatal Multivit-Min-Fe-FA (PRENATAL VITAMINS PO) Take by mouth.    Historical Provider, MD   BP 147/95 mmHg  Pulse 93  Temp(Src) 98.4 F (36.9 C) (Oral)  Resp 20  SpO2 100%  LMP 12/28/2014 Physical Exam  Constitutional: She appears well-developed and well-nourished. No distress.  Overweight  HENT:  Head: Normocephalic and atraumatic.  Right Ear: External ear normal.  Left Ear: External  ear normal.  Eyes: Conjunctivae are normal. Right eye exhibits no discharge. Left eye exhibits no discharge. No scleral icterus.  Neck: Normal range of motion. Neck supple. No tracheal deviation present.  Cardiovascular: Normal rate, regular rhythm and normal heart sounds.   Pulmonary/Chest: Effort normal and breath sounds normal. No stridor. No respiratory distress.  Musculoskeletal: She exhibits no edema.  Neurological: She is alert. Cranial nerve deficit: no gross deficits.  Skin: Skin is warm and dry. No rash noted.  Psychiatric: She has a normal mood and affect.  Nursing note and vitals reviewed.   ED Course  Procedures (including critical care time) Labs Review Labs  Reviewed - No data to display  Imaging Review No results found.   EKG Interpretation None      MDM   Final diagnoses:  Essential hypertension    The patient's blood pressure is 147/95. He is asymptomatic. I commended she continue her current regimen. Follow up with her doctor as planned.    Dorie Rank, MD 12/30/14 2052

## 2014-12-30 NOTE — ED Notes (Signed)
Pt reports she was put on new medication for hypertension and states med has been ineffective. Pt states she just started new med two days ago.

## 2014-12-30 NOTE — Discharge Instructions (Signed)

## 2015-02-24 ENCOUNTER — Encounter: Payer: Medicaid Other | Attending: Nurse Practitioner

## 2015-02-24 VITALS — Ht 66.0 in | Wt 269.5 lb

## 2015-02-24 DIAGNOSIS — Z713 Dietary counseling and surveillance: Secondary | ICD-10-CM | POA: Diagnosis not present

## 2015-02-24 DIAGNOSIS — E119 Type 2 diabetes mellitus without complications: Secondary | ICD-10-CM | POA: Diagnosis not present

## 2015-02-24 DIAGNOSIS — Z6841 Body Mass Index (BMI) 40.0 and over, adult: Secondary | ICD-10-CM | POA: Diagnosis not present

## 2015-02-24 NOTE — Progress Notes (Signed)

## 2015-03-03 ENCOUNTER — Encounter: Payer: Medicaid Other | Attending: Nurse Practitioner

## 2015-03-03 DIAGNOSIS — Z713 Dietary counseling and surveillance: Secondary | ICD-10-CM | POA: Diagnosis not present

## 2015-03-03 DIAGNOSIS — E119 Type 2 diabetes mellitus without complications: Secondary | ICD-10-CM

## 2015-03-03 DIAGNOSIS — Z6841 Body Mass Index (BMI) 40.0 and over, adult: Secondary | ICD-10-CM | POA: Insufficient documentation

## 2015-03-03 NOTE — Progress Notes (Signed)
Patient was seen on 03/03/15 for the second of a series of three diabetes self-management courses at the [facility]. The following learning objectives were met by the patient during this class:   Describe the role of different macronutrients on glucose  Explain how carbohydrates affect blood glucose  State what foods contain the most carbohydrates  Demonstrate carbohydrate counting  Demonstrate how to read Nutrition Facts food label  Describe effects of various fats on heart health  Describe the importance of good nutrition for health and healthy eating strategies  Describe techniques for managing your shopping, cooking and meal planning  List strategies to follow meal plan when dining out  Describe the effects of alcohol on glucose and how to use it safely  Goals:  Follow Diabetes Meal Plan as instructed  Eat 3 meals and 2 snacks, every 3-5 hrs  Aim for carbohydrate intake of 45 grams carbohydrate/meal Aim for carbohydrate intake of 0-15 grams carbohydrate/snack Add lean protein foods to meals/snacks  Monitor glucose levels as instructed by your doctor   Follow-Up Plan:  Attend Core 3  Work towards following your personal food plan.

## 2015-03-10 DIAGNOSIS — E119 Type 2 diabetes mellitus without complications: Secondary | ICD-10-CM | POA: Diagnosis not present

## 2015-03-10 NOTE — Progress Notes (Signed)
Patient was seen on 03/10/15 for the third of a series of three diabetes self-management courses at the [facility].   [name] the amount of activity recommended for healthy living . Describe activities suitable for individual needs . Identify ways to regularly incorporate activity into daily life . Identify barriers to activity and ways to over come these barriers  Identify diabetes medications being personally used and their primary action for lowering glucose and possible side effects . Describe role of stress on blood glucose and develop strategies to address psychosocial issues . Identify diabetes complications and ways to prevent them  Explain how to manage diabetes during illness . Evaluate success in meeting personal goal . Establish 2-3 goals that they will plan to diligently work on until they return for the  36-month follow-up visit  Goals:   I will be active  5 times a week  Your patient has identified these potential barriers to change:  Motivation  Your patient has identified their diabetes self-care support plan as  Family Support Plan:  Attend Core 4 in 4 months

## 2015-05-12 ENCOUNTER — Ambulatory Visit: Payer: Medicaid Other | Attending: Neurological Surgery | Admitting: Physical Therapy

## 2015-05-12 DIAGNOSIS — M545 Low back pain, unspecified: Secondary | ICD-10-CM

## 2015-05-12 DIAGNOSIS — R293 Abnormal posture: Secondary | ICD-10-CM | POA: Insufficient documentation

## 2015-05-12 DIAGNOSIS — R531 Weakness: Secondary | ICD-10-CM | POA: Diagnosis present

## 2015-05-12 NOTE — Therapy (Signed)
[place], [place], [address] Phone: [phone]   Fax:  [phone]  Physical Therapy Evaluation/One time Evaluation  Patient Details  Name: [name] MRN: [id number] Date of Birth: [birth year] Referring Provider:  Bernerd Limbo, MD  Encounter Date: 05/12/2015      PT End of Session - 05/12/15 1817    Visit Number 1   Number of Visits 1   Date for PT Re-Evaluation 05/12/15   Authorization Type MEDICAID   PT Start Time 0130   PT Stop Time 0214   PT Time Calculation (min) 44 min   Activity Tolerance Patient tolerated treatment well   Behavior During Therapy [facility] for tasks assessed/performed      Past Medical History  Diagnosis Date  . Hypertension   . Fibroid   . Ovarian cyst   . Complication of anesthesia     Slow to arouse after anesthesia  . Asthma   . Diabetes mellitus without complication   . Obesity     Past Surgical History  Procedure Laterality Date  . Appendectomy    . Dilation and curettage of uterus    . Ovarian cyst removal      There were no vitals filed for this visit.  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     Limitations Standing;Walking;House hold activities;Sitting   How long can you sit comfortably? 1 hour   How long can you stand comfortably? 30 min   How long can you walk comfortably? 30 min   Diagnostic tests x ray   Patient Stated Goals Learn some exercises   Currently in Pain? Yes   Pain Score 7    Pain Location Back   Pain Orientation Mid;Right;Left   Pain Descriptors / Indicators Aching;Pounding   Pain Type Chronic pain   Pain Onset More than a month ago   Pain Frequency Constant   Aggravating Factors  heavy lifting, standing for longer than 30 min, unable to sleep well   Pain Relieving Factors nothing            [facility] PT Assessment - 05/12/15 1339    Assessment   Medical Diagnosis low back pain   Onset Date/Surgical Date 02/09/15  recently discovered she is DM but had back pain since 2006   Hand Dominance Right   Prior Therapy at chiropractor last year   Precautions   Precautions None   Restrictions   Weight Bearing Restrictions No   Balance Screen   Has the patient fallen in the past 6 months No   Has the patient had a decrease in activity level because of a fear of falling?  No   Is the patient reluctant to leave their home because of a fear of falling?  No   Home Social worker Private residence   Living Arrangements Spouse/significant other;Non-relatives/Friends;Children   Type of Home Apartment   Home Access Stairs to enter   Entrance Stairs-Number of Steps 14   Entrance Stairs-Rails Can reach both   Prior Function   Level of Independence Independent   Cognition   Overall Cognitive Status Within Functional Limits for tasks assessed   Posture/Postural Control   Posture/Postural  Control No significant limitations   Postural Limitations Rounded Shoulders;Forward head;Increased lumbar lordosis;Anterior pelvic tilt   Posture Comments increased abdominal girth   AROM   Lumbar Flexion 50  ERP   Lumbar Extension 15  ERP and tightness   Lumbar - Right Side Bend 25   Lumbar - Left Side Bend 25   Lumbar - Right Rotation 70 %   Lumbar - Left Rotation 70%  ERP  mid back   Palpation   Palpation comment Pt with pain on palpation of mid back, hypomobility of lumbar spine.  tenderness on bil gluteals and Quadratus lumborum muscles,marked tenderness of R PSIS and reduced with muscle energy technique         Pt given handout for  Transversus Abdominis Exercises and prone mulitifidus exercises  Ab set 10 sec hold in neutral spine AB  set with clams x 10 Ab set with marching x 10  Cat and camel   Child's pose forwards and to the side bil Hold both stretches for 30 sec each.  Pt also given multifidus exercises in prone  Pelvis press Pelvis press with knee flex single Right and Left and then bil 10 x each Pelvis press with hip ext leg straight and then hip ext with knee flex  Right and then left  10 x each  Also , ADL's and posture in sitting and standing                    PT Education - 05/12/15 1350    Education provided Yes   Education Details Pt educated on HEP initial and simple posture for standing sitting and ADL's   Person(s) Educated Patient   Methods Explanation;Demonstration;Handout;Verbal cues   Comprehension Verbalized understanding;Returned demonstration          PT Short Term Goals - 05/12/15 1823    PT SHORT TERM GOAL #1   Title STG=LTG           PT Long Term Goals - 05/12/15 1613    PT LONG TERM GOAL #1   Title Pt will be educated on initial HEP   Time 1   Period Days   Status Achieved   PT LONG TERM GOAL #2   Title Pt will be educated on sitting /standing posture and given handouts for ADL's and back protection   Time 1   Period Days   Status Achieved               Plan - 05/12/15 1823    Clinical Impression Statement PT enters clinic with 10 year history of back pain and history or working in [place] on [employer].  Pt presents with poor postural habits.  generalized weakness of core musculature and fair abdominal strength, Due to Medicaid liimtation , pt was educated and given initial HEP.  but is unable to conitnue due to financial limititation.  Pt was given infomration about [facility] a pro bono PT clinic in [place].      Pt will benefit from skilled therapeutic intervention in order to improve on the following deficits Hypomobility;Decreased strength;Decreased range of motion;Improper body mechanics;Postural dysfunction;Pain;Obesity   Rehab  Potential Fair   PT Frequency One time visit   PT Treatment/Interventions Therapeutic exercise;ADLs/Self Care Home Management;Patient/family education   PT Next Visit Plan one time visit MEDICAID         Problem List There are no active problems to display for this patient.    Due to changes in the [facility]  Policy for Rehab as of June 1st , 2014-- this patient does not have a qualifying diagnosis that is covered.  The patient is unable topay out of pocket expenses at this time, there fore will not be seen for treatment.  Pt was given intial HEP and information about community resources Voncille Lo, [place] 05/12/2015 6:32 PM Phone: [phone] Fax: [facility] [address] [place], [place], [address] Phone: [phone]   Fax:  [phone]

## 2015-05-12 NOTE — Patient Instructions (Signed)
Pt given handout for  Transversus Abdominis Exercises   Ab set 10 sec hold in neutral spine AB set with clams x 10 Ab set with marching x 10  Cat and camel   Child's pose forwards and to the side Hold both stretches for 30 sec each.  Pt also given multifidus exercises in prone  Pelvis press Pelvis press with knee flex single Right and Left and then bil 10 x each Pelvis press with hip ext leg straight and then hip ext with knee flex  Right and then left  10 x each  .Posture Tips DO: - stand tall and erect - keep chin tucked in - keep head and shoulders in alignment - check posture regularly in mirror or large window - pull head back against headrest in car seat;  Change your position often.  Sit with lumbar support. DON'T: - slouch or slump while watching TV or reading - sit, stand or lie in one position  for too long;  Sitting is especially hard on the spine so if you sit at a desk/use the computer, then stand up often!   Copyright  VHI. All rights reserved.  Posture - Standing   Good posture is important. Avoid slouching and forward head thrust. Maintain curve in low back and align ears over shoul- ders, hips over ankles.  Pull your belly button in toward your back bone.  Stand with even weight in toes and heels.  Dont lean hips and head forward. Lift chest up toward sky.  Not military  Tuck chin down   Copyright  VHI. All rights reserved.  Posture - Sitting   Sit upright, head facing forward. Try using a roll to support lower back. Keep shoulders relaxed, and avoid rounded back. Keep hips level with knees. Avoid crossing legs for long periods.  Sit on sit bones not your tail bone.     [name], PT 05/12/2015 2:13 PM Phone: [phone] Fax: [phone]    Copyright  VHI. All rights reserved.

## 2015-06-11 ENCOUNTER — Encounter: Payer: Self-pay | Admitting: Gastroenterology

## 2015-06-29 ENCOUNTER — Encounter: Payer: Self-pay | Admitting: Gastroenterology

## 2015-06-29 ENCOUNTER — Ambulatory Visit (INDEPENDENT_AMBULATORY_CARE_PROVIDER_SITE_OTHER): Payer: Medicaid Other | Admitting: Gastroenterology

## 2015-06-29 VITALS — BP 114/80 | HR 72 | Ht 65.0 in | Wt 262.0 lb

## 2015-06-29 DIAGNOSIS — R12 Heartburn: Secondary | ICD-10-CM

## 2015-06-29 DIAGNOSIS — R1310 Dysphagia, unspecified: Secondary | ICD-10-CM | POA: Diagnosis not present

## 2015-06-29 MED ORDER — PANTOPRAZOLE SODIUM 40 MG PO TBEC: 40.0000 mg | DELAYED_RELEASE_TABLET | Freq: Every day | ORAL | Status: DC

## 2015-06-29 NOTE — Progress Notes (Signed)
[name]    [id number]    [birth year]  Primary Care Physician:BOUSKA,DAVID E, MD  Referring Physician: Bernerd Limbo, MD [address] [place], [place] [address]  Chief complaint: Dysphagia HPI: 39 year old female here with complaints of dysphagia predominantly to solids in the past 1 year, she has difficulty with meat, bread and salads, occasionally to pills. She has episodes where she feels that the food is stuck in her throat, she tries to push it down with drinking water and with piece of bread. Sometimes she has to regurgitate or vomit. Denies any ER visits with food impaction. No difficulty with swallowing liquids. She is losing weight unintentionally. On review of systems complained of heartburn every day and also positive for acid taste in the mouth with hoarseness or sore throat. She is currently not taking any acid suppression medication. She is planning to get pregnant soon.   Outpatient Encounter Prescriptions as of 06/29/2015  Medication Sig  . albuterol (PROVENTIL HFA;VENTOLIN HFA) 108 (90 BASE) MCG/ACT inhaler Inhale 1-2 puffs into the lungs every 6 (six) hours as needed for wheezing or shortness of breath.  . hydrochlorothiazide (HYDRODIURIL) 25 MG tablet Take 25 mg by mouth daily.  . metFORMIN (GLUCOPHAGE) 500 MG tablet Take 500 mg by mouth 3 (three) times daily.  . methyldopa (ALDOMET) 250 MG tablet Take 250 mg by mouth 3 (three) times daily.  . naproxen sodium (ANAPROX) 220 MG tablet Take 660 mg by mouth every 4 (four) hours as needed (menstrual pain.).  Marland Kitchen potassium chloride (K-DUR) 10 MEQ tablet Take 10 mEq by mouth 2 (two) times daily.  . Prenatal Multivit-Min-Fe-FA (PRENATAL VITAMINS PO) Take by mouth.  . [DISCONTINUED] acetaminophen (TYLENOL) 500 MG tablet Take 1,000 mg by mouth every 6 (six) hours as needed for pain.  . [DISCONTINUED] albuterol (PROVENTIL HFA;VENTOLIN HFA) 108 (90 BASE) MCG/ACT inhaler Inhale  2 puffs into the lungs every 4 (four) hours as needed for wheezing or shortness of breath.  . [DISCONTINUED] atenolol-chlorthalidone (TENORETIC) 50-25 MG per tablet Take 1 tablet by mouth daily.  . [DISCONTINUED] lisinopril (PRINIVIL,ZESTRIL) 20 MG tablet Take 20 mg by mouth daily.  . [DISCONTINUED] predniSONE (DELTASONE) 50 MG tablet Take 1 tablet (50 mg total) by mouth daily.   No facility-administered encounter medications on file as of 06/29/2015.    Allergies as of 06/29/2015 - Review Complete 06/29/2015  Allergen Reaction Noted  . Ciprofloxacin Shortness Of Breath, Diarrhea, and Other (See Comments) 04/14/2011  . Metronidazole Shortness Of Breath, Diarrhea, and Other (See Comments) 04/14/2011  . Toradol [ketorolac tromethamine] Anaphylaxis and Hives 02/17/2012    Past Medical History  Diagnosis Date  . Hypertension   . Fibroid   . Ovarian cyst   . Complication of anesthesia     Slow to arouse after anesthesia  . Asthma   . Diabetes mellitus without complication ([place])   . Obesity   . Anal fissure   . Anxiety   . Chronic headaches   . Bronchitis     Past Surgical History  Procedure Laterality Date  . Appendectomy    . Dilation and curettage of uterus    . Ovarian cyst removal      Family History  Problem Relation Age of Onset  . Hypertension Mother   . Diabetes Father   . Aneurysm Paternal Aunt   . Schizophrenia Daughter   . Anxiety disorder Daughter   . ADD /  ADHD Daughter   . Heart murmur Daughter   . Sleep disorder Daughter     Social History   Social History  . Marital Status: Single    Spouse Name: N/A  . Number of Children: 1  . 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 Smoking status: Never Smoker   . Smokeless tobacco: Not on file  . Alcohol Use: Yes     Comment: occ  . Drug Use: No  . Sexual Activity: Yes    Birth Control/ Protection: None   Other Topics Concern  . Not on  file   Social History Narrative      Review of systems: Review of Systems  Constitutional: Negative for fever and chills.  HENT: Negative.   Eyes: Negative for blurred vision.  Respiratory: Negative for cough, shortness of breath and wheezing.   Cardiovascular: Negative for chest pain and palpitations.  Gastrointestinal: as per HPI Genitourinary: Negative for dysuria, urgency, frequency and hematuria.  Musculoskeletal: Negative for myalgias, back pain and joint pain.  Skin: Negative for itching and rash.  Neurological: Negative for dizziness, tremors, focal weakness, seizures and loss of consciousness.  Endo/Heme/Allergies: Negative for environmental allergies.  Psychiatric/Behavioral: Negative for depression, suicidal ideas and hallucinations.  All other systems reviewed and are negative.   Physical Exam: Filed Vitals:   06/29/15 1359  BP: 114/80  Pulse: 72   Gen:      No acute distress, obese HEENT:  EOMI, sclera anicteric Neck:     No masses; no thyromegaly Lungs:    Clear to auscultation bilaterally; normal respiratory effort CV:         Regular rate and rhythm; no murmurs Abd:      + bowel sounds; soft, non-tender; no palpable masses, no distension Ext:    No edema; adequate peripheral perfusion Skin:      Warm and dry; no rash Neuro: alert and oriented x 3 Psych: normal mood and affect  Data Reviewed: Reviewed her chart   Assessment and Plan/Recommendations: 39 year old female with history of type 2 diabetes, obesity here with complaints of solid dysphagia. Schedule for EGD with esophageal biopsies and also possible dilation. She could have esophageal ring or peptic stricture which could potentially cause her symptoms. We'll also need to rule out eosinophilic esophagitis. Advised patient to avoid eating meat and bread until the procedure and to stick with low residue diet. Start protonix 40 mg daily, 30 minutes before breakfast. Also discussed antireflux  measures no meals 3 hours before bedtime and sleep with head end elevation. Return after EGD  K. Denzil Magnuson , MD [phone] Mon-Fri 8a-5p [phone] after 5p, weekends, holidays

## 2015-06-29 NOTE — Patient Instructions (Signed)

## 2015-07-29 ENCOUNTER — Ambulatory Visit: Payer: Medicaid Other

## 2015-07-30 ENCOUNTER — Ambulatory Visit (INDEPENDENT_AMBULATORY_CARE_PROVIDER_SITE_OTHER): Payer: Medicaid Other | Admitting: Neurology

## 2015-07-30 ENCOUNTER — Encounter: Payer: Self-pay | Admitting: Neurology

## 2015-07-30 VITALS — BP 122/85 | HR 73 | Ht 65.0 in | Wt 262.0 lb

## 2015-07-30 DIAGNOSIS — R413 Other amnesia: Secondary | ICD-10-CM

## 2015-07-30 DIAGNOSIS — G43009 Migraine without aura, not intractable, without status migrainosus: Secondary | ICD-10-CM

## 2015-07-30 DIAGNOSIS — G4710 Hypersomnia, unspecified: Secondary | ICD-10-CM | POA: Diagnosis not present

## 2015-07-30 DIAGNOSIS — G43909 Migraine, unspecified, not intractable, without status migrainosus: Secondary | ICD-10-CM | POA: Insufficient documentation

## 2015-07-30 NOTE — Progress Notes (Signed)
PATIENT: [name] DOB: [birth year]  Chief Complaint  Patient presents with  . agnosia    Patient states she does not get much sleep. Her boyfriend says she snores when she sleeps. She c/o closing her eyes longer than she should when she blinks. She also c/o having trouble with her memory for a while now.      HISTORICAL  [name] is a 39 years old right-handed female, seen in refer by  her primary care physician Dr. 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  Since 2014, she noticed constellation of complaints, she feels tired fatigue excessive sleepiness all the time, has difficulty sleeping at nighttime, often not during the daytime, even during driving, she feels like her eye blinking time was too long, often woke up startled that she was too close to the vehicle in front of her, she felt overwhelmingly fatigued, but he slumped over, but she has not had seizure like activity, today's ESS score is 16  She also complains of difficulty concentrating, difficulty remembering, sometimes forgot what kind of vehicle she was driving,   She has chronic migraines, bothers her occasionally, over-the-counter Excedrin Migraine only help her temporarily  REVIEW OF SYSTEMS: Full 14 system review of systems performed and notable only for weight gain, fatigue, trouble swallowing, snoring, constipation, feeling hot, feeling cold, joint pain, achy muscles, allergy, memory loss, headaches, difficulty swallowing, insomnia, sleepiness, snoring, restless leg, not enough sleep, decreased energy, change in appetite  ALLERGIES: Allergies  Allergen Reactions  . Ciprofloxacin Shortness Of Breath, Diarrhea and Other (See Comments)    numbness  . Metronidazole Shortness Of Breath, Diarrhea and Other (See Comments)    numbness  . Shellfish-Derived Products Anaphylaxis  .  Toradol [Ketorolac Tromethamine] Anaphylaxis and Hives  . Eggs Or Egg-Derived Products     vomiting  . Sulfur   . Amoxicillin Nausea And Vomiting  . Influenza Vaccines Nausea And Vomiting    HOME MEDICATIONS: Current Outpatient Prescriptions  Medication Sig Dispense Refill  . albuterol (PROVENTIL HFA;VENTOLIN HFA) 108 (90 BASE) MCG/ACT inhaler Inhale 1-2 puffs into the lungs every 6 (six) hours as needed for wheezing or shortness of breath. 1 Inhaler 0  . hydrochlorothiazide (HYDRODIURIL) 25 MG tablet Take 25 mg by mouth daily.    . metFORMIN (GLUCOPHAGE) 500 MG tablet Take 500 mg by mouth 3 (three) times daily.    . methyldopa (ALDOMET) 250 MG tablet Take 250 mg by mouth 3 (three) times daily.    . pantoprazole (PROTONIX) 40 MG tablet Take 1 tablet (40 mg total) by mouth daily. 90 tablet 3  . potassium chloride (K-DUR) 10 MEQ tablet Take 10 mEq by mouth 2 (two) times daily.    . Prenatal Multivit-Min-Fe-FA (PRENATAL VITAMINS PO) Take by mouth.     No current facility-administered medications for this visit.    PAST MEDICAL HISTORY: Past Medical History  Diagnosis Date  . Hypertension   . Fibroid   . Ovarian cyst   . Complication of anesthesia     Slow to arouse after anesthesia  . Asthma   . Diabetes mellitus without complication ([place])   . Obesity   . Anal fissure   . Anxiety   . Chronic headaches   . Bronchitis     PAST SURGICAL HISTORY: Past Surgical History  Procedure Laterality Date  . Appendectomy    . Dilation  and curettage of uterus    . Ovarian cyst removal    . Wisdom tooth extraction      FAMILY HISTORY: Family History  Problem Relation Age of Onset  . Hypertension Mother   . Diabetes Father   . Hypertension Father   . Stroke Father   . Heart attack Father   . Aneurysm Paternal Aunt   . Schizophrenia Daughter   . Anxiety disorder Daughter   . ADD / ADHD Daughter   . Heart murmur Daughter   . Sleep disorder Daughter     SOCIAL  HISTORY:  Social History   Social History  . Marital Status: Single    Spouse Name: N/A  . Number of Children: 1  . 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 Smoking status: Never Smoker   . Smokeless tobacco: Never Used  . Alcohol Use: No     Comment: occ  . Drug Use: No  . Sexual Activity: Yes    Birth Control/ Protection: None   Other Topics Concern  . Not on file   Social History Narrative   Patient rarely drinks caffeine.   Patient is right handed.      PHYSICAL EXAM   Filed Vitals:   07/30/15 1500  BP: 122/85  Pulse: 73  Height: 5\' 5"  (1.651 m)  Weight: 262 lb (118.842 kg)    Not recorded      Body mass index is 43.6 kg/(m^2).  PHYSICAL EXAMNIATION:  Gen: NAD, conversant, well nourised, obese, well groomed                     Cardiovascular: Regular rate rhythm, no peripheral edema, warm, nontender. Eyes: Conjunctivae clear without exudates or hemorrhage Neck: Supple, no carotid bruise. Pulmonary: Clear to auscultation bilaterally   NEUROLOGICAL EXAM:  MENTAL STATUS: Speech:    Speech is normal; fluent and spontaneous with normal comprehension.  Cognition:     Orientation to time, place and person     Normal recent and remote memory     Normal Attention span and concentration     Normal Language, naming, repeating,spontaneous speech     Fund of knowledge   CRANIAL NERVES: CN II: Visual fields are full to confrontation. Fundoscopic exam is normal with sharp discs and no vascular changes. Pupils are round equal and briskly reactive to light. CN III, IV, VI: extraocular movement are normal. No ptosis. CN V: Facial sensation is intact to pinprick in all 3 divisions bilaterally. Corneal responses are intact.  CN VII: Face is symmetric with normal eye closure and smile. CN VIII: Hearing is normal to rubbing fingers CN IX, X: Palate elevates symmetrically. Phonation is normal. CN XI:  Head turning and shoulder shrug are intact CN XII: Tongue is midline with normal movements and no atrophy.  MOTOR: There is no pronator drift of out-stretched arms. Muscle bulk and tone are normal. Muscle strength is normal.  REFLEXES: Reflexes are 2+ and symmetric at the biceps, triceps, knees, and ankles. Plantar responses are flexor.  SENSORY: Intact to light touch, pinprick, position sense, and vibration sense are intact in fingers and toes.  COORDINATION: Rapid alternating movements and fine finger movements are intact. There is no dysmetria on finger-to-nose and heel-knee-shin.    GAIT/STANCE: Posture is normal. Gait is steady with normal steps, base, arm swing, and turning. Heel and toe walking are normal. Tandem gait is  normal.  Romberg is absent.  DIAGNOSTIC DATA (LABS, IMAGING, TESTING) - I reviewed patient records, labs, notes, testing and imaging myself where available.  ASSESSMENT AND PLAN  DERON ALLENSWORTH is a 39 y.o. female    Excessive drowsiness  She has obesity, narrow oropharyngeal, ESS score is 16   Referred to sleep study for possible obstructive sleep apnea Chronic migraine Memory loss  MRI of brain without contrast  Laboratory evaluations        Marcial Pacas, M.D. Ph.D.  [facility] [address], [place], [place] [address] Ph: [phone] Fax: [phone]  CC: Bernerd Limbo, MD

## 2015-07-31 LAB — COMPREHENSIVE METABOLIC PANEL
A/G RATIO: 1.1 (ref 1.1–2.5)
ALT: 304 IU/L — ABNORMAL HIGH (ref 0–32)
AST: 149 IU/L — AB (ref 0–40)
Albumin: 4.1 g/dL (ref 3.5–5.5)
Alkaline Phosphatase: 70 IU/L (ref 39–117)
BUN/Creatinine Ratio: 12 (ref 8–20)
BUN: 12 mg/dL (ref 6–20)
Bilirubin Total: 0.4 mg/dL (ref 0.0–1.2)
CALCIUM: 9.6 mg/dL (ref 8.7–10.2)
CO2: 25 mmol/L (ref 18–29)
Chloride: 97 mmol/L (ref 97–106)
Creatinine, Ser: 0.98 mg/dL (ref 0.57–1.00)
GFR calc non Af Amer: 74 mL/min/{1.73_m2} (ref >59)
GFR, EST AFRICAN AMERICAN: 85 mL/min/{1.73_m2} (ref >59)
GLOBULIN, TOTAL: 3.7 g/dL (ref 1.5–4.5)
Glucose: 87 mg/dL (ref 65–99)
POTASSIUM: 3.5 mmol/L (ref 3.5–5.2)
SODIUM: 139 mmol/L (ref 136–144)
TOTAL PROTEIN: 7.8 g/dL (ref 6.0–8.5)

## 2015-07-31 LAB — ANA W/REFLEX IF POSITIVE
ANA: POSITIVE — AB
Anti JO-1: <0.2 AI (ref 0.0–0.9)
Centromere Ab Screen: <0.2 AI (ref 0.0–0.9)
Chromatin Ab SerPl-aCnc: 0.5 AI (ref 0.0–0.9)
DSDNA AB: 14 [IU]/mL — AB (ref 0–9)
ENA RNP AB: 7.9 AI — AB (ref 0.0–0.9)
ENA SM Ab Ser-aCnc: <0.2 AI (ref 0.0–0.9)
ENA SSA (RO) Ab: <0.2 AI (ref 0.0–0.9)
ENA SSB (LA) Ab: <0.2 AI (ref 0.0–0.9)
Scleroderma SCL-70: <0.2 AI (ref 0.0–0.9)

## 2015-07-31 LAB — CBC
HEMATOCRIT: 36.3 % (ref 34.0–46.6)
Hemoglobin: 11.7 g/dL (ref 11.1–15.9)
MCH: 28.6 pg (ref 26.6–33.0)
MCHC: 32.2 g/dL (ref 31.5–35.7)
MCV: 89 fL (ref 79–97)
Platelets: 364 10*3/uL (ref 150–379)
RBC: 4.09 x10E6/uL (ref 3.77–5.28)
RDW: 14.6 % (ref 12.3–15.4)
WBC: 6.4 10*3/uL (ref 3.4–10.8)

## 2015-07-31 LAB — C-REACTIVE PROTEIN: CRP: 14.4 mg/L — ABNORMAL HIGH (ref 0.0–4.9)

## 2015-07-31 LAB — TSH: TSH: 1.55 u[IU]/mL (ref 0.450–4.500)

## 2015-07-31 LAB — VITAMIN B12: Vitamin B-12: 845 pg/mL (ref 211–946)

## 2015-07-31 LAB — SEDIMENTATION RATE: Sed Rate: 52 mm/hr — ABNORMAL HIGH (ref 0–32)

## 2015-08-03 ENCOUNTER — Telehealth: Payer: Self-pay | Admitting: Neurology

## 2015-08-03 DIAGNOSIS — R5383 Other fatigue: Secondary | ICD-10-CM | POA: Insufficient documentation

## 2015-08-03 DIAGNOSIS — R899 Unspecified abnormal finding in specimens from other organs, systems and tissues: Secondary | ICD-10-CM

## 2015-08-03 DIAGNOSIS — R5382 Chronic fatigue, unspecified: Secondary | ICD-10-CM

## 2015-08-03 NOTE — Telephone Encounter (Signed)
She is aware of her lab results and to expect a call from rheumatology.  She will keep all her other pending appts.

## 2015-08-03 NOTE — Telephone Encounter (Signed)
Please call patient, laboratory showed elevated ESR, C-reactive protein, positive ANA, ENP antibodies  Above abnormality indicate inflammatory process, also associated with autoimmune disease, I will refer her to rheumatologist for further evaluations  Continue rest of the treatment plan as discussed during last visit

## 2015-08-06 ENCOUNTER — Ambulatory Visit
Admission: RE | Admit: 2015-08-06 | Discharge: 2015-08-06 | Disposition: A | Discharge status: Home / Self Care | Payer: Medicaid Other | Source: Ambulatory Visit | Attending: Neurology | Admitting: Neurology

## 2015-08-06 DIAGNOSIS — R413 Other amnesia: Secondary | ICD-10-CM

## 2015-08-06 DIAGNOSIS — G43009 Migraine without aura, not intractable, without status migrainosus: Secondary | ICD-10-CM

## 2015-08-06 DIAGNOSIS — G4710 Hypersomnia, unspecified: Secondary | ICD-10-CM

## 2015-08-10 ENCOUNTER — Telehealth: Payer: Self-pay | Admitting: *Deleted

## 2015-08-10 NOTE — Telephone Encounter (Signed)
-----   Message from Marcial Pacas, MD sent at 08/07/2015  2:39 PM EST ----- Please call pt for normal MRI brain. Evidence of mild right sphenoid sinusitis.

## 2015-08-10 NOTE — Telephone Encounter (Signed)
I called and LMVM for pt to return call.   

## 2015-08-10 NOTE — Telephone Encounter (Signed)
Pt returned Sandy's call °

## 2015-08-10 NOTE — Telephone Encounter (Signed)
LMVM for pt to return call for MRI results.  

## 2015-08-10 NOTE — Telephone Encounter (Signed)
I called and spoke to pt and relayed the normal MRI scan.  She will address chronic sphenoid sinusitis with her pcp.  I will forward to them.

## 2015-08-11 ENCOUNTER — Ambulatory Visit (INDEPENDENT_AMBULATORY_CARE_PROVIDER_SITE_OTHER): Payer: Medicaid Other | Admitting: Neurology

## 2015-08-11 DIAGNOSIS — R410 Disorientation, unspecified: Secondary | ICD-10-CM | POA: Diagnosis not present

## 2015-08-11 DIAGNOSIS — G43009 Migraine without aura, not intractable, without status migrainosus: Secondary | ICD-10-CM

## 2015-08-11 DIAGNOSIS — G4710 Hypersomnia, unspecified: Secondary | ICD-10-CM

## 2015-08-11 DIAGNOSIS — R413 Other amnesia: Secondary | ICD-10-CM

## 2015-08-12 NOTE — Procedures (Signed)
   HISTORY: 39 years old female, with history of memory disturbance, migraine headaches  TECHNIQUE:  16 channel EEG was performed based on standard 10-16 international system. One channel was dedicated to EKG, which has demonstrates normal sinus rhythm of 78 beats per minutes.  Upon awakening, the posterior background activity was well-developed, in alpha range, 11 Hz, with amplitude of 30 microvoltage, reactive to eye opening and closure.  There was no evidence of epileptiform discharge.  Photic stimulation was performed, which induced a symmetric photic driving.  Hyperventilation was performed, there was no abnormality elicit.  No sleep was achieved.  CONCLUSION: This is a  normal awake EEG.  There is no electrodiagnostic evidence of epileptiform discharge

## 2015-08-13 ENCOUNTER — Telehealth: Payer: Self-pay | Admitting: Neurology

## 2015-08-13 NOTE — Telephone Encounter (Signed)
Spoke to patient and advised her that because of her insurance PCP would have to do referral for Rheumatology  Due to insurance. Patient is aware of details.

## 2015-08-19 ENCOUNTER — Encounter: Payer: Self-pay | Admitting: Neurology

## 2015-08-19 ENCOUNTER — Ambulatory Visit (INDEPENDENT_AMBULATORY_CARE_PROVIDER_SITE_OTHER): Payer: Medicaid Other | Admitting: Neurology

## 2015-08-19 VITALS — BP 128/83 | HR 91 | Resp 20 | Ht 66.0 in | Wt 260.0 lb

## 2015-08-19 DIAGNOSIS — R51 Headache: Secondary | ICD-10-CM | POA: Diagnosis not present

## 2015-08-19 DIAGNOSIS — R519 Headache, unspecified: Secondary | ICD-10-CM

## 2015-08-19 DIAGNOSIS — K219 Gastro-esophageal reflux disease without esophagitis: Secondary | ICD-10-CM

## 2015-08-19 DIAGNOSIS — G2581 Restless legs syndrome: Secondary | ICD-10-CM | POA: Diagnosis not present

## 2015-08-19 DIAGNOSIS — R351 Nocturia: Secondary | ICD-10-CM | POA: Diagnosis not present

## 2015-08-19 DIAGNOSIS — G4719 Other hypersomnia: Secondary | ICD-10-CM

## 2015-08-19 DIAGNOSIS — R0683 Snoring: Secondary | ICD-10-CM | POA: Diagnosis not present

## 2015-08-19 NOTE — Progress Notes (Signed)
Subjective:    Patient ID: [name] is a 39 y.o. female.  HPI     Star Age, MD, PhD [facility] [address]. [place], [place] [address]  Dear Aliene Beams,   I saw your patient, [name], upon your kind request in my clinic today for initial consultation of her sleep disorder, in particular, concern for underlying obstructive sleep apnea. The patient is unaccompanied today. As you know, [name] is a 39 year old right-handed woman with an underlying medical history of reflux disease, hypertension, asthma, diabetes, anxiety, migraine headaches and severe obesity, who reports snoring and excessive daytime somnolence. Her sleepiness has been ongoing for year and a half. She lives at home with her 42 year old daughter. She currently does not work. Her boyfriend has reported very loud snoring to her. She is not sure if she has breathing pauses while asleep. She has occasional [name] headaches and nocturia, usually once per night. In addition, she endorses restless leg symptoms and the need to move her legs at night and sometimes the need to get up and walk around. She is not sure if she twitches her legs in her sleep. She has no overt family history of obstructive sleep apnea or narcolepsy. She is in bed usually by 8 PM and sleeps for prolonged periods of time. Sometimes she is in bed until 11 AM the next day. She denies. She does not drink caffeine on a day-to-day basis. She does not smoke or drink alcohol. She denies any abnormal dreams or parasomnias or cataplexy or hallucinations at night.  Her Epworth sleepiness score is 18 out of 24 today, her fatigue score is 59 out of 63.  I reviewed your office note from 07/30/2015. She had an EEG on 08/11/2015 which was reported as normal in the awake state.   Her Past Medical History Is Significant For: Past Medical History  Diagnosis Date  . Hypertension   . Fibroid   . Ovarian cyst   .  Complication of anesthesia     Slow to arouse after anesthesia  . Asthma   . Diabetes mellitus without complication ([place])   . Obesity   . Anal fissure   . Anxiety   . Chronic headaches   . Bronchitis     Her Past Surgical History Is Significant For: Past Surgical History  Procedure Laterality Date  . Appendectomy    . Dilation and curettage of uterus    . Ovarian cyst removal    . Wisdom tooth extraction      Her Family History Is Significant For: Family History  Problem Relation Age of Onset  . Hypertension Mother   . Diabetes Father   . Hypertension Father   . Stroke Father   . Heart attack Father   . Aneurysm Paternal Aunt   . Schizophrenia Daughter   . Anxiety disorder Daughter   . ADD / ADHD Daughter   . Heart murmur Daughter   . Sleep disorder Daughter     Her Social History Is Significant For: Social History   Social History  . Marital Status: Single    Spouse Name: N/A  . Number of Children: 1  . 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 Smoking status: Never Smoker   . Smokeless tobacco: Never Used  . Alcohol Use: No     Comment: occ  . Drug Use: No  .  Sexual Activity: Yes    Birth Control/ Protection: None   Other Topics Concern  . None   Social History Narrative   Patient rarely drinks caffeine.   Patient is right handed.     Her Allergies Are:  Allergies  Allergen Reactions  . Ciprofloxacin Shortness Of Breath, Diarrhea and Other (See Comments)    numbness  . Metronidazole Shortness Of Breath, Diarrhea and Other (See Comments)    numbness  . Shellfish-Derived Products Anaphylaxis  . Toradol [Ketorolac Tromethamine] Anaphylaxis and Hives  . Eggs Or Egg-Derived Products     vomiting  . Sulfur   . Amoxicillin Nausea And Vomiting  . Influenza Vaccines Nausea And Vomiting  :   Her Current Medications Are:  Outpatient Encounter Prescriptions as of 08/19/2015   Medication Sig  . albuterol (PROVENTIL HFA;VENTOLIN HFA) 108 (90 BASE) MCG/ACT inhaler Inhale 1-2 puffs into the lungs every 6 (six) hours as needed for wheezing or shortness of breath.  . hydrochlorothiazide (HYDRODIURIL) 25 MG tablet Take 25 mg by mouth daily.  . metFORMIN (GLUCOPHAGE) 500 MG tablet Take 500 mg by mouth 3 (three) times daily.  . methyldopa (ALDOMET) 250 MG tablet Take 250 mg by mouth 3 (three) times daily.  . pantoprazole (PROTONIX) 40 MG tablet Take 1 tablet (40 mg total) by mouth daily.  . potassium chloride (K-DUR) 10 MEQ tablet Take 10 mEq by mouth 2 (two) times daily.  . Prenatal Multivit-Min-Fe-FA (PRENATAL VITAMINS PO) Take by mouth.   No facility-administered encounter medications on file as of 08/19/2015.  :  Review of Systems:  Out of a complete 14 point review of systems, all are reviewed and negative with the exception of these symptoms as listed below:   Review of Systems  Constitutional: Positive for fatigue.  Respiratory: Positive for shortness of breath.   Gastrointestinal: Positive for constipation.  Endocrine: Positive for cold intolerance.  Musculoskeletal:       Joint pain Aching muscles   Neurological:       Memory issues Restless legs    Psychiatric/Behavioral: Positive for confusion.       Not enough sleep Decrease energy   Epworth Sleepiness Scale 0= would never doze 1= slight chance of dozing 2= moderate chance of dozing 3= high chance of dozing  Sitting and reading:2 Watching TV:2 Sitting inactive in a public place (ex. Theater or meeting):3 As a passenger in a car for an hour without a break:2 Lying down to rest in the afternoon:3 Sitting and talking to someone:0 Sitting quietly after lunch (no alcohol):3 In a car, while stopped in traffic3: Total:18   Objective:  Neurologic Exam  Physical Exam Physical Examination:   Filed Vitals:   08/19/15 1054  BP: 128/83  Pulse: 91  Resp: 20    General Examination: The  patient is a very pleasant 39 y.o. female in no acute distress. She appears well-developed and well-nourished and adequately groomed.   HEENT: Normocephalic, atraumatic, pupils are equal, round and reactive to light and accommodation. Funduscopic exam is normal with sharp disc margins noted. Extraocular tracking is good without limitation to gaze excursion or nystagmus noted. Normal smooth pursuit is noted. Hearing is grossly intact. Tympanic membranes are clear bilaterally. Face is symmetric with normal facial animation and normal facial sensation. Speech is clear with no dysarthria noted. There is no hypophonia. There is no lip, neck/head, jaw or voice tremor. Neck is supple with full range of passive and active motion. There are no carotid bruits on auscultation. Oropharynx  exam reveals: mild mouth dryness, adequate dental hygiene and moderate airway crowding, due to wider tongue and tonsils of 1 + in place. Mallampati is class II. Tongue protrudes centrally and palate elevates symmetrically. Neck size 15 5/8.   Chest: Clear to auscultation without wheezing, rhonchi or crackles noted.  Heart: S1+S2+0, regular and normal without murmurs, rubs or gallops noted.   Abdomen: Soft, non-tender and non-distended with normal bowel sounds appreciated on auscultation.  Extremities: There is no pitting edema in the distal lower extremities bilaterally. Pedal pulses are intact.  Skin: Warm and dry without trophic changes noted.   Musculoskeletal: exam reveals no obvious joint deformities, tenderness or joint swelling or erythema.   Neurologically:  Mental status: The patient is awake, alert and oriented in all 4 spheres. Her immediate and remote memory, attention, language skills and fund of knowledge are appropriate. There is no evidence of aphasia, agnosia, apraxia or anomia. Speech is clear with normal prosody and enunciation. Thought process is linear. Mood is normal and affect is normal.  Cranial nerves  II - XII are as described above under HEENT exam. In addition: shoulder shrug is normal with equal shoulder height noted. Motor exam: Normal bulk, strength and tone is noted. There is no drift, tremor or rebound. Romberg is negative. Reflexes are 2+ throughout. Fine motor skills and coordination: intact with normal finger taps, normal hand movements, normal rapid alternating patting, normal foot taps and normal foot agility.  Cerebellar testing: No dysmetria or intention tremor on finger to nose testing. Heel to shin is unremarkable bilaterally. There is no truncal or gait ataxia.  Sensory exam: intact to light touch in the upper and lower extremities.  Gait, station and balance: She stands easily. No veering to one side is noted. No leaning to one side is noted. Posture is age-appropriate and stance is narrow based. Gait shows normal stride length and normal pace. No problems turning are noted. She turns en bloc. Tandem walk is unremarkable. Intact toe and heel stance is noted.               Assessment and Plan:  In summary, YIREH MIRSKY is a very pleasant 39 y.o.-year old female with an underlying medical history of reflux disease, hypertension, asthma, diabetes, anxiety, migraine headaches and severe obesity, whose history and physical exam are concerning for obstructive sleep apnea (OSA). She also endorses RLS symptoms and we will be on the lookout for PLMs. I had a long chat with the patient about my findings and the diagnosis of OSA, its prognosis and treatment options. We talked about medical treatments, surgical interventions and non-pharmacological approaches. I explained in particular the risks and ramifications of untreated moderate to severe OSA, especially with respect to developing cardiovascular disease down the Road, including congestive heart failure, difficult to treat hypertension, cardiac arrhythmias, or stroke. Even type 2 diabetes has, in part, been linked to untreated OSA.  Symptoms of untreated OSA include daytime sleepiness, memory problems, mood irritability and mood disorder such as depression and anxiety, lack of energy, as well as recurrent headaches, especially [name] headaches. We talked about trying to maintain a healthy lifestyle in general, as well as the importance of weight control. I encouraged the patient to eat healthy, exercise daily and keep well hydrated, to keep a scheduled bedtime and wake time routine, to not skip any meals and eat healthy snacks in between meals. I advised the patient not to drive when feeling sleepy. I recommended the following at this time:  sleep study with potential positive airway pressure titration. (We will score hypopneas at 4% and split the sleep study into diagnostic and treatment portion, if the estimated. 2 hour AHI is >15/h).   I explained the sleep test procedure to the patient and also outlined possible surgical and non-surgical treatment options of OSA, including the use of a custom-made dental device (which would require a referral to a specialist dentist or oral surgeon), upper airway surgical options, such as pillar implants, radiofrequency surgery, tongue base surgery, and UPPP (which would involve a referral to an ENT surgeon). Rarely, jaw surgery such as mandibular advancement may be considered.  I also explained the CPAP treatment option to the patient, who indicated that she would be willing to try CPAP if the need arises. I explained the importance of being compliant with PAP treatment, not only for insurance purposes but primarily to improve Her symptoms, and for the patient's long term health benefit, including to reduce Her cardiovascular risks. I answered all her questions today and the patient was in agreement. I would like to see her back after the sleep study is completed and encouraged her to call with any interim questions, concerns, problems or updates.   Thank you very much for allowing me to participate  in the care of this nice patient. If I can be of any further assistance to you please do not hesitate to talk to me.  Sincerely,   Star Age, MD, PhD

## 2015-08-19 NOTE — Patient Instructions (Signed)

## 2015-09-04 ENCOUNTER — Encounter: Payer: Medicaid Other | Admitting: Gastroenterology

## 2015-09-17 ENCOUNTER — Ambulatory Visit (INDEPENDENT_AMBULATORY_CARE_PROVIDER_SITE_OTHER): Payer: Medicaid Other | Admitting: Neurology

## 2015-09-17 ENCOUNTER — Encounter: Payer: Self-pay | Admitting: Neurology

## 2015-09-17 VITALS — BP 134/84 | HR 75 | Ht 66.0 in | Wt 261.0 lb

## 2015-09-17 DIAGNOSIS — M545 Low back pain, unspecified: Secondary | ICD-10-CM | POA: Insufficient documentation

## 2015-09-17 DIAGNOSIS — G43009 Migraine without aura, not intractable, without status migrainosus: Secondary | ICD-10-CM

## 2015-09-17 MED ORDER — SUMATRIPTAN SUCCINATE 50 MG PO TABS: 50.0000 mg | ORAL_TABLET | ORAL | Status: DC | PRN

## 2015-09-17 NOTE — Progress Notes (Signed)
Chief Complaint  Patient presents with  . Excessive sleepiness    She has a sleep study scheduled for 09/20/15.  . Migraine    She would like to discuss her MRI and EEG results. Says her headache frequency has increased.    . Gait Problem/Back Pain    She is having more difficulty walking, especially in the mornings.  She is currently taking Tramadol and Prednisone 20mg  daily x 5 days that was prescribed by her PCP.  She also took a five days of Prednisone 50mg  daily a few weeks ago.  She has a pending referral to see a rheumatologist.      PATIENT: Krista [name] DOB: [birth year]  Chief Complaint  Patient presents with  . Excessive sleepiness    She has a sleep study scheduled for 09/20/15.  . Migraine    She would like to discuss her MRI and EEG results. Says her headache frequency has increased.    . Gait Problem/Back Pain    She is having more difficulty walking, especially in the mornings.  She is currently taking Tramadol and Prednisone 20mg  daily x 5 days that was prescribed by her PCP.  She also took a five days of Prednisone 50mg  daily a few weeks ago.  She has a pending referral to see a rheumatologist.     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  Since 2014, she noticed constellation of [name], she feels tired fatigue, excessive sleepiness all the time, has difficulty sleeping at nighttime,nod off during the daytime, even during driving, she feels like her eye blinking time was too long, often woke up startled that she was too close to the vehicle in front of her, she felt overwhelmingly fatigued, she has not had seizure like activity, today's ESS score is 16  She also complains of difficulty concentrating, difficulty remembering,  sometimes forgot what kind of vehicle she was driving,   She has chronic migraines, over-the-counter Excedrin Migraine only help her temporarily  UPDATE Sep 17 2015: I have personally reviewed MRI of the brain December 2016 that was normal, she had one major headaches since last visit lasting for 3 days she has tried over-the-counter Aleve, without helping, her headache is associated with noise sensitivity, dizziness,  She was also evaluated by sleep specialist Dr. Rexene Alberts, sleep study is pending  REVIEW OF SYSTEMS: Full 14 system review of systems performed and notable only for  fatigue, light sensitivity, shortness of breath, chest tightness, palpitation, restless leg, insomnia, daytime sleepiness, joint pain, back pain, achy muscles, neck stiffness, headaches, numbness  ALLERGIES: Allergies  Allergen Reactions  . Ciprofloxacin Shortness Of Breath, Diarrhea and Other (See Comments)    numbness  . Metronidazole Shortness Of Breath, Diarrhea and Other (See Comments)    numbness  . Shellfish-Derived Products Anaphylaxis  . Toradol [Ketorolac Tromethamine] Anaphylaxis and Hives  . Eggs Or Egg-Derived Products     vomiting  . Sulfur   . Amoxicillin Nausea And Vomiting  . Influenza Vaccines Nausea And Vomiting    HOME MEDICATIONS: Current Outpatient Prescriptions  Medication Sig Dispense Refill  . albuterol (PROVENTIL HFA;VENTOLIN HFA) 108 (90 BASE) MCG/ACT inhaler Inhale 1-2 puffs into the lungs every 6 (six) hours as needed for wheezing or shortness of breath. 1 Inhaler  0  . hydrochlorothiazide (HYDRODIURIL) 25 MG tablet Take 25 mg by mouth daily.    . metFORMIN (GLUCOPHAGE) 500 MG tablet Take 500 mg by mouth 3 (three) times daily.    . methocarbamol (ROBAXIN) 500 MG tablet Take 500 mg by mouth.    . methyldopa (ALDOMET) 250 MG tablet Take 250 mg by mouth 3 (three) times daily.    . pantoprazole (PROTONIX) 40 MG tablet Take 1 tablet (40 mg total) by mouth daily. 90 tablet 3  .  potassium chloride (K-DUR) 10 MEQ tablet Take 10 mEq by mouth 2 (two) times daily.    . predniSONE (DELTASONE) 20 MG tablet Take 20 mg by mouth.    . Prenatal Multivit-Min-Fe-FA (PRENATAL VITAMINS PO) Take by mouth.    . traMADol (ULTRAM) 50 MG tablet Take 50 mg by mouth.     No current facility-administered medications for this visit.    PAST MEDICAL HISTORY: Past Medical History  Diagnosis Date  . Hypertension   . Fibroid   . Ovarian cyst   . Complication of anesthesia     Slow to arouse after anesthesia  . Asthma   . Diabetes mellitus without complication ([place])   . Obesity   . Anal fissure   . Anxiety   . Chronic headaches   . Bronchitis     PAST SURGICAL HISTORY: Past Surgical History  Procedure Laterality Date  . Appendectomy    . Dilation and curettage of uterus    . Ovarian cyst removal    . Wisdom tooth extraction      FAMILY HISTORY: Family History  Problem Relation Age of Onset  . Hypertension Mother   . Diabetes Father   . Hypertension Father   . Stroke Father   . Heart attack Father   . Aneurysm Paternal Aunt   . Schizophrenia Daughter   . Anxiety disorder Daughter   . ADD / ADHD Daughter   . Heart murmur Daughter   . Sleep disorder Daughter     SOCIAL HISTORY:  Social History   Social History  . Marital Status: Single    Spouse Name: N/A  . Number of Children: 1  . 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 Smoking status: Never Smoker   . Smokeless tobacco: Never Used  . Alcohol Use: No     Comment: occ  . Drug Use: No  . Sexual Activity: Yes    Birth Control/ Protection: None   Other Topics Concern  . Not on file   Social History Narrative   Patient rarely drinks caffeine.   Patient is right handed.      PHYSICAL EXAM   Filed Vitals:   09/17/15 1442  BP: 134/84  Pulse: 75  Height: 5\' 6"  (1.676 m)  Weight: 261 lb (118.389 kg)    Not recorded      Body  mass index is 42.15 kg/(m^2).  PHYSICAL EXAMNIATION:  Gen: NAD, conversant, well nourised, obese, well groomed                     Cardiovascular: Regular rate rhythm, no peripheral edema, warm, nontender. Eyes: Conjunctivae clear without exudates or hemorrhage Neck: Supple, no carotid bruise. Pulmonary: Clear to auscultation bilaterally   NEUROLOGICAL EXAM:  MENTAL STATUS: Speech:    Speech is normal; fluent and spontaneous with normal comprehension.  Cognition:     Orientation to time,  place and person     Normal recent and remote memory     Normal Attention span and concentration     Normal Language, naming, repeating,spontaneous speech     Fund of knowledge   CRANIAL NERVES: CN II: Visual fields are full to confrontation. Fundoscopic exam is normal with sharp discs and no vascular changes. Pupils are round equal and briskly reactive to light. CN III, IV, VI: extraocular movement are normal. No ptosis. CN V: Facial sensation is intact to pinprick in all 3 divisions bilaterally. Corneal responses are intact.  CN VII: Face is symmetric with normal eye closure and smile. CN VIII: Hearing is normal to rubbing fingers CN IX, X: Palate elevates symmetrically. Phonation is normal. CN XI: Head turning and shoulder shrug are intact CN XII: Tongue is midline with normal movements and no atrophy.  MOTOR: There is no pronator drift of out-stretched arms. Muscle bulk and tone are normal. Muscle strength is normal.  REFLEXES: Reflexes are 2+ and symmetric at the biceps, triceps, knees, and ankles. Plantar responses are flexor.  SENSORY: Intact to light touch, pinprick, position sense, and vibration sense are intact in fingers and toes.  COORDINATION: Rapid alternating movements and fine finger movements are intact. There is no dysmetria on finger-to-nose and heel-knee-shin.    GAIT/STANCE: Mildly antalgic due to low back pain, cautious, steady.  DIAGNOSTIC DATA (LABS, IMAGING,  TESTING) - I reviewed patient records, labs, notes, testing and imaging myself where available.  ASSESSMENT AND PLAN  Krista [name] is a 40 y.o. [name]    Excessive drowsiness  Sleep study is pending  She has obesity, narrow oropharyngeal, ESS score is 16, he is at high risk for obstructive sleep apnea Chronic migraine  Imitrex 50 mg as needed Low back pain  No evidence of lumbar radiculopathy, most consistent with musculoskeletal etiology  I have suggested back stretching exercise, heating pad, when necessary NSAIDs       Marcial Pacas, M.D. Ph.D.  [facility] [address], [place], [place] [address] Ph: [phone] Fax: [phone]  CC: Bernerd Limbo, MD

## 2015-09-20 ENCOUNTER — Ambulatory Visit (INDEPENDENT_AMBULATORY_CARE_PROVIDER_SITE_OTHER): Payer: Medicaid Other | Admitting: Neurology

## 2015-09-20 DIAGNOSIS — G4710 Hypersomnia, unspecified: Secondary | ICD-10-CM

## 2015-09-20 DIAGNOSIS — G4720 Circadian rhythm sleep disorder, unspecified type: Secondary | ICD-10-CM

## 2015-09-20 DIAGNOSIS — G479 Sleep disorder, unspecified: Secondary | ICD-10-CM

## 2015-09-20 NOTE — Sleep Study (Signed)
Please see the scanned sleep study interpretation located in the Procedure tab within the Chart Review section. 

## 2015-09-24 ENCOUNTER — Telehealth: Payer: Self-pay | Admitting: Neurology

## 2015-09-24 NOTE — Telephone Encounter (Signed)
Patient referred by Dr. Krista Blue, seen by me on 08/19/15, diagnostic PSG on 09/20/15.   Please call and notify the patient that the recent sleep study did not show any significant obstructive sleep apnea or leg twitching. She did not sleep well or very much unfortunately.  She can FU with Dr. Krista Blue. Also, route or fax report to PCP and referring MD, if other than PCP.  Please remind patient to try to maintain good sleep hygiene, which means: Keep a regular sleep and wake schedule, try not to exercise or have a meal within 2 hours of your bedtime, try to keep your bedroom conducive for sleep, that is, cool and dark, without light distractors such as an illuminated alarm clock, and refrain from watching TV right before sleep or in the middle of the night and do not keep the TV or radio on during the night. Also, try not to use or play on electronic devices at bedtime, such as your cell phone, tablet PC or laptop. If you like to read at bedtime on an electronic device, try to dim the background light as much as possible. Do not eat in the middle of the night.   Once you have spoken to patient, you can close this encounter.   Thanks,  Star Age, MD, PhD [facility])

## 2015-09-28 NOTE — Telephone Encounter (Signed)
Called patient, female answered and said this is not [name] and hung up. I will try back later.

## 2015-09-29 NOTE — Telephone Encounter (Signed)
LM on VM that sleep study did not show any evidence of sleep apnea or leg twitching. I advised her to f/u with Dr. Krista Blue. I will mail patient a hand out about good sleep hygiene techniques. I will send report to PCP.

## 2016-01-07 DIAGNOSIS — F429 Obsessive-compulsive disorder, unspecified: Secondary | ICD-10-CM | POA: Insufficient documentation

## 2016-01-07 DIAGNOSIS — E118 Type 2 diabetes mellitus with unspecified complications: Secondary | ICD-10-CM | POA: Insufficient documentation

## 2016-02-03 ENCOUNTER — Ambulatory Visit: Payer: Medicaid Other | Admitting: Neurology

## 2016-02-03 ENCOUNTER — Telehealth: Payer: Self-pay | Admitting: *Deleted

## 2016-02-03 NOTE — Telephone Encounter (Signed)
Patient called the morning of her appt.  Stated she had a problem with her hip and could not come today.

## 2016-02-04 ENCOUNTER — Encounter: Payer: Self-pay | Admitting: Neurology

## 2016-03-11 ENCOUNTER — Encounter: Payer: Self-pay | Admitting: Gastroenterology

## 2016-03-11 ENCOUNTER — Ambulatory Visit (INDEPENDENT_AMBULATORY_CARE_PROVIDER_SITE_OTHER): Payer: Medicaid Other | Admitting: Gastroenterology

## 2016-03-11 VITALS — BP 110/80 | HR 88 | Ht 64.5 in | Wt 260.4 lb

## 2016-03-11 DIAGNOSIS — R1310 Dysphagia, unspecified: Secondary | ICD-10-CM

## 2016-03-11 DIAGNOSIS — K5900 Constipation, unspecified: Secondary | ICD-10-CM | POA: Diagnosis not present

## 2016-03-11 DIAGNOSIS — R634 Abnormal weight loss: Secondary | ICD-10-CM

## 2016-03-11 DIAGNOSIS — K219 Gastro-esophageal reflux disease without esophagitis: Secondary | ICD-10-CM

## 2016-03-11 MED ORDER — LINACLOTIDE 145 MCG PO CAPS: 145.0000 ug | ORAL_CAPSULE | Freq: Every day | ORAL | Status: DC

## 2016-03-11 MED ORDER — OMEPRAZOLE 40 MG PO CPDR: 40.0000 mg | DELAYED_RELEASE_CAPSULE | Freq: Every day | ORAL | Status: DC

## 2016-03-11 NOTE — Patient Instructions (Signed)
You have been scheduled for a Barium Esophogram at [facility] (1st floor of the hospital) on  03/14/2016 at 11:30am. Please arrive 15 minutes prior to your appointment for registration. Make certain not to have anything to eat or drink 6 hours prior to your test. If you need to reschedule for any reason, please contact radiology at [phone] to do so. __________________________________________________________________ A barium swallow is an examination that concentrates on views of the esophagus. This tends to be a double contrast exam (barium and two liquids which, when combined, create a gas to distend the wall of the oesophagus) or single contrast (non-ionic iodine based). The study is usually tailored to your symptoms so a good history is essential. Attention is paid during the study to the form, structure and configuration of the esophagus, looking for functional disorders (such as aspiration, dysphagia, achalasia, motility and reflux) EXAMINATION You may be asked to change into a gown, depending on the type of swallow being performed. A radiologist and radiographer will perform the procedure. The radiologist will advise you of the type of contrast selected for your procedure and direct you during the exam. You will be asked to stand, sit or lie in several different positions and to hold a small amount of fluid in your mouth before being asked to swallow while the imaging is performed .In some instances you may be asked to swallow barium coated marshmallows to assess the motility of a solid food bolus. The exam can be recorded as a digital or video fluoroscopy procedure. POST PROCEDURE It will take 1-2 days for the barium to pass through your system. To facilitate this, it is important, unless otherwise directed, to increase your fluids for the next 24-48hrs and to resume your normal diet.  This test typically takes about 30 minutes to  perform. __________________________________________________________________________________  We will send Linzess and prilosec to your pharmacy

## 2016-03-14 ENCOUNTER — Ambulatory Visit (HOSPITAL_COMMUNITY): Admission: RE | Admit: 2016-03-14 | Payer: Medicaid Other | Source: Ambulatory Visit

## 2016-03-14 NOTE — Progress Notes (Signed)
[name]    AY:[id number]    11/03/1975  Primary Care Physician:BOUSKA,DAVID E, MD  Referring Physician: Bernerd Limbo, MD [place], [place] [address]  Chief complaint:  GERD , dysphagia   HPI:  40 year old female here with complaints of dysphagia predominantly to solids in the past 1 year, she has difficulty with meat, bread and salads, occasionally to pills. She has episodes where she feels that the food is stuck in her throat, she tries to push it down with drinking water. Sometimes she has to regurgitate or vomit. Denies any ER visits with food impaction. No difficulty with swallowing liquids. She is continuing to lose weight unintentionally. She is taking PPI as needed and reports improvement when she takes PPI. She canceled EGD that was scheduled in October 2016. Patient is very anxious about undergoing EGD with sedation. On review of systems complained of chronic constipation, currently taking over-the-counter stool softener with no significant improvement. She is on Plaquenil for lupus with significant improvement of joint pains. Denies any nausea, vomiting, abdominal pain, melena or bright red blood per rectum   Outpatient Encounter Prescriptions as of 03/11/2016  Medication Sig  . albuterol (PROVENTIL HFA;VENTOLIN HFA) 108 (90 BASE) MCG/ACT inhaler Inhale 1-2 puffs into the lungs every 6 (six) hours as needed for wheezing or shortness of breath.  . hydrochlorothiazide (HYDRODIURIL) 25 MG tablet Take 25 mg by mouth daily.  . hydroxychloroquine (PLAQUENIL) 200 MG tablet Take 1 tablet by mouth 2 (two) times daily.  . metFORMIN (GLUCOPHAGE) 500 MG tablet Take 500 mg by mouth 3 (three) times daily.  . methocarbamol (ROBAXIN) 500 MG tablet Take 500 mg by mouth.  . methyldopa (ALDOMET) 250 MG tablet Take 250 mg by mouth 3 (three) times daily.  . naproxen (NAPROSYN) 500 MG tablet Take 1 tablet by mouth daily.  . sertraline (ZOLOFT) 50 MG  tablet Take 1 tablet by mouth daily.  . traMADol (ULTRAM) 50 MG tablet Take 50 mg by mouth.  . linaclotide (LINZESS) 145 MCG CAPS capsule Take 1 capsule (145 mcg total) by mouth daily before breakfast.  . omeprazole (PRILOSEC) 40 MG capsule Take 1 capsule (40 mg total) by mouth daily.  . [DISCONTINUED] pantoprazole (PROTONIX) 40 MG tablet Take 1 tablet (40 mg total) by mouth daily.  . [DISCONTINUED] potassium chloride (K-DUR) 10 MEQ tablet Take 10 mEq by mouth 2 (two) times daily.  . [DISCONTINUED] predniSONE (DELTASONE) 20 MG tablet Take 20 mg by mouth.  . [DISCONTINUED] Prenatal Multivit-Min-Fe-FA (PRENATAL VITAMINS PO) Take by mouth.  . [DISCONTINUED] SUMAtriptan (IMITREX) 50 MG tablet Take 1 tablet (50 mg total) by mouth every 2 (two) hours as needed for migraine. May repeat in 2 hours if headache persists or recurs.   No facility-administered encounter medications on file as of 03/11/2016.    Allergies as of 03/11/2016 - Review Complete 03/11/2016  Allergen Reaction Noted  . Ciprofloxacin Shortness Of Breath, Diarrhea, and Other (See Comments) 04/14/2011  . Metronidazole Shortness Of Breath, Diarrhea, and Other (See Comments) 04/14/2011  . Shellfish-derived products Anaphylaxis 07/30/2015  . Toradol [ketorolac tromethamine] Anaphylaxis and Hives 02/17/2012  . Eggs or egg-derived products  07/30/2015  . Sulfur  07/30/2015  . Amoxicillin Nausea And Vomiting 07/30/2015  . Influenza vaccines Nausea And Vomiting 07/30/2015    Past Medical History  Diagnosis Date  . Hypertension   . Fibroid   . Ovarian cyst   . Complication of  anesthesia     Slow to arouse after anesthesia  . Asthma   . Diabetes mellitus without complication ([place])   . Obesity   . Anal fissure   . Anxiety   . Chronic headaches   . Bronchitis   . Lupus [facility])     Past Surgical History  Procedure Laterality Date  . Appendectomy    . Dilation and curettage of uterus    . Ovarian cyst removal    . Wisdom tooth  extraction      Family History  Problem Relation Age of Onset  . Hypertension Mother   . Diabetes Father   . Hypertension Father   . Stroke Father   . Heart attack Father   . Aneurysm Paternal Aunt   . Schizophrenia Daughter   . Anxiety disorder Daughter   . ADD / ADHD Daughter   . Heart murmur Daughter   . Sleep disorder Daughter     Social History   Social History  . Marital Status: Single    Spouse Name: N/A  . Number of Children: 1  . 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 Smoking status: Never Smoker   . Smokeless tobacco: Never Used  . Alcohol Use: No     Comment: occ  . Drug Use: No  . Sexual Activity: Yes    Birth Control/ Protection: None   Other Topics Concern  . Not on file   Social History Narrative   Patient rarely drinks caffeine.   Patient is right handed.       Review of systems: Review of Systems  Constitutional: Negative for fever and chills.  HENT: Negative.   Eyes: Negative for blurred vision.  Respiratory: Negative for cough, shortness of breath and wheezing.   Cardiovascular: Negative for chest pain and palpitations.  Gastrointestinal: as per HPI Genitourinary: Negative for dysuria, urgency, frequency and hematuria.  Musculoskeletal: Positive for myalgias, back pain and joint pain.  Skin: Negative for itching and rash.  Neurological: Negative for dizziness, tremors, focal weakness, seizures and loss of consciousness.  Psychiatric/Behavioral: Negative for depression, suicidal ideas and hallucinations.  All other systems reviewed and are negative.   Physical Exam: Filed Vitals:   03/11/16 1332  BP: 110/80  Pulse: 88   Gen:      No acute distress HEENT:  EOMI, sclera anicteric Neck:     No masses; no thyromegaly Lungs:    Clear to auscultation bilaterally; normal respiratory effort CV:         Regular rate and rhythm; no murmurs Abd:      + bowel sounds; soft,  non-tender; no palpable masses, no distension Ext:    No edema; adequate peripheral perfusion Skin:      Warm and dry; no rash Neuro: alert and oriented x 3 Psych: normal mood and affect  Data Reviewed:  Reviewed chart in epic   Assessment and Plan/Recommendations:  40 year old female with history of lupus, GERD with complaints of solid dysphagia intermittent for past year and also chronic constipation here for follow-up visit Patient continues to lose weight and intentionally  GERD and Dysphagia Patient likely has a peptic stricture, other differential diagnosis include eosinophilic esophagitis, esophageal rings, uncontrolled GERD, or malignancy  She is extremely reluctant to undergo EGD for evaluation Agrees to do barium swallow study and if barium study shows significant abnormality she is willing to undergo EGD Continue PPI daily  and follow antireflux measures Avoid red meat, crusty bread and raw vegetables Chew food well before swallowing  Constipation: We'll start Linzess 145 g daily Advised patient to drink adequate fluids and increase dietary fiber intake   Greater than 50% of the time used for counseling as well as treatment plan and follow-up. He had multiple questions which were answered to his satisfaction    K. Denzil Magnuson , MD [phone] Mon-Fri 8a-5p [phone] after 5p, weekends, holidays  CC: Bernerd Limbo, MD

## 2016-03-28 ENCOUNTER — Telehealth: Payer: Self-pay | Admitting: Gastroenterology

## 2016-03-28 NOTE — Telephone Encounter (Signed)
Moved her DG esophagus to 04/07/16 at 9:30 am. She will begin fasting at 6:30 am. She will arrive at 9:15 am to [facility]. Patient agrees to this change of appointment.

## 2016-03-31 ENCOUNTER — Ambulatory Visit (HOSPITAL_COMMUNITY): Payer: Medicaid Other

## 2016-04-05 ENCOUNTER — Ambulatory Visit (HOSPITAL_COMMUNITY): Payer: Medicaid Other

## 2016-04-07 ENCOUNTER — Ambulatory Visit (HOSPITAL_COMMUNITY)
Admission: RE | Admit: 2016-04-07 | Discharge: 2016-04-07 | Disposition: A | Discharge status: Home / Self Care | Payer: Medicaid Other | Source: Ambulatory Visit | Attending: Gastroenterology | Admitting: Gastroenterology

## 2016-04-07 DIAGNOSIS — K449 Diaphragmatic hernia without obstruction or gangrene: Secondary | ICD-10-CM | POA: Diagnosis not present

## 2016-04-07 DIAGNOSIS — K219 Gastro-esophageal reflux disease without esophagitis: Secondary | ICD-10-CM | POA: Insufficient documentation

## 2016-04-07 DIAGNOSIS — R1310 Dysphagia, unspecified: Secondary | ICD-10-CM | POA: Diagnosis present

## 2016-05-19 ENCOUNTER — Telehealth: Payer: Self-pay | Admitting: Gastroenterology

## 2016-05-19 NOTE — Telephone Encounter (Signed)
Please advise. I can't find where I called her with any recommendations.

## 2016-05-20 NOTE — Telephone Encounter (Signed)
Barium swallow showed evidence of small sliding hiatal hernia and some episodes of reflux, no evidence of sticture. Barium table passed easily through EG junction. Please advise patient to take PPI and follow anti reflux measures. Thanks

## 2016-05-20 NOTE — Telephone Encounter (Signed)
Discussed the results with the patient. Information mailed to her.

## 2016-05-21 ENCOUNTER — Encounter: Payer: Self-pay | Admitting: *Deleted

## 2017-02-16 ENCOUNTER — Telehealth: Payer: Self-pay | Admitting: Gastroenterology

## 2017-02-16 ENCOUNTER — Other Ambulatory Visit: Payer: Self-pay

## 2017-02-16 MED ORDER — OMEPRAZOLE 40 MG PO CPDR: 40.0000 mg | DELAYED_RELEASE_CAPSULE | Freq: Every day | ORAL | 3 refills | Status: DC

## 2017-02-16 NOTE — Telephone Encounter (Signed)
Ok, please check the reason for discontinuing PPI. Schedule for EGD to evaluate solid dysphagia and possible esophageal dilation.

## 2017-02-16 NOTE — Telephone Encounter (Signed)
The patient was seen last fall by [place] for dysphagia. She had a barium swallow that showed sliding hiatal hernia. She was put on PPI Omeprazole. She has Lupus which is managed by [facility]. Per patient, Rheumatology said she should stop taking Omeprazole. She has been off of it for "months". She begin having her dysphagia about 2 months ago. She is now having difficulty with solid foods. I have requested a call back from rheumatology to verify what the plan was with stopping the Omeprazole.

## 2017-02-16 NOTE — Telephone Encounter (Signed)
I have spoke with Rheumatology. The patient misunderstood the instructions. Also, she was given Meloxicam which the patient admits she is taking. I will schedule her a direct EGD if she will agree.

## 2017-02-16 NOTE — Telephone Encounter (Signed)
Ok to schedule EGD direct, please advise patient to restart PPI and limit use of Meloxicam. Thanks

## 2017-02-16 NOTE — Telephone Encounter (Signed)
Patient is advised. She expresses understanding and agreement with this plan.

## 2017-02-17 ENCOUNTER — Ambulatory Visit (AMBULATORY_SURGERY_CENTER): Payer: Self-pay

## 2017-02-17 VITALS — Ht 65.5 in | Wt 276.2 lb

## 2017-02-17 DIAGNOSIS — R1310 Dysphagia, unspecified: Secondary | ICD-10-CM

## 2017-02-17 DIAGNOSIS — R1319 Other dysphagia: Secondary | ICD-10-CM

## 2017-03-08 ENCOUNTER — Ambulatory Visit (AMBULATORY_SURGERY_CENTER): Payer: Medicaid Other | Admitting: Gastroenterology

## 2017-03-08 ENCOUNTER — Encounter: Payer: Self-pay | Admitting: Gastroenterology

## 2017-03-08 VITALS — BP 153/84 | HR 63 | Temp 98.6°F | Resp 23 | Ht 65.5 in | Wt 276.0 lb

## 2017-03-08 DIAGNOSIS — R1310 Dysphagia, unspecified: Secondary | ICD-10-CM

## 2017-03-08 DIAGNOSIS — R1319 Other dysphagia: Secondary | ICD-10-CM

## 2017-03-08 MED ORDER — SODIUM CHLORIDE 0.9 % IV SOLN: 500.0000 mL | INTRAVENOUS | Status: DC

## 2017-03-08 MED ORDER — RANITIDINE HCL 150 MG/10ML PO SYRP: 150.0000 mg | ORAL_SOLUTION | Freq: Every day | ORAL | 11 refills | Status: DC

## 2017-03-08 MED ORDER — OMEPRAZOLE 2 MG/ML ORAL SUSPENSION: ORAL | 11 refills | Status: DC

## 2017-03-08 NOTE — Progress Notes (Signed)
Pt's states no medical or surgical changes since previsit  

## 2017-03-08 NOTE — Patient Instructions (Signed)
YOU HAD AN ENDOSCOPIC PROCEDURE TODAY AT [facility]:   Refer to the procedure report that was given to you for any specific questions about what was found during the examination.  If the procedure report does not answer your questions, please call your gastroenterologist to clarify.  If you requested that your care partner not be given the details of your procedure findings, then the procedure report has been included in a sealed envelope for you to review at your convenience later.  YOU SHOULD EXPECT: Some feelings of bloating in the abdomen. Passage of more gas than usual.  Walking can help get rid of the air that was put into your GI tract during the procedure and reduce the bloating. If you had a lower endoscopy (such as a colonoscopy or flexible sigmoidoscopy) you may notice spotting of blood in your stool or on the toilet paper. If you underwent a bowel prep for your procedure, you may not have a normal bowel movement for a few days.  Please Note:  You might notice some irritation and congestion in your nose or some drainage.  This is from the oxygen used during your procedure.  There is no need for concern and it should clear up in a day or so.  SYMPTOMS TO REPORT IMMEDIATELY:    Following upper endoscopy (EGD)  Vomiting of blood or coffee ground material  New chest pain or pain under the shoulder blades  Painful or persistently difficult swallowing  New shortness of breath  Fever of 100F or higher  Black, tarry-looking stools  For urgent or emergent issues, a gastroenterologist can be reached at any hour by calling [phone].   DIET:  We do recommend a small meal at first, but then you may proceed to your regular diet.  Drink plenty of fluids but you should avoid alcoholic beverages for 24 hours.  ACTIVITY:  You should plan to take it easy for the rest of today and you should NOT DRIVE or use heavy machinery until tomorrow (because of the sedation medicines used  during the test).    FOLLOW UP: Our staff will call the number listed on your records the next business day following your procedure to check on you and address any questions or concerns that you may have regarding the information given to you following your procedure. If we do not reach you, we will leave a message.  However, if you are feeling well and you are not experiencing any problems, there is no need to return our call.  We will assume that you have returned to your regular daily activities without incident.  If any biopsies were taken you will be contacted by phone or by letter within the next 1-3 weeks.  Please call us at [phone] if you have not heard about the biopsies in 3 weeks.    SIGNATURES/CONFIDENTIALITY: You and/or your care partner have signed paperwork which will be entered into your electronic medical record.  These signatures attest to the fact that that the information above on your After Visit Summary has been reviewed and is understood.  Full responsibility of the confidentiality of this discharge information lies with you and/or your care-partner.  Medications as directed.  Esophagel manometry at appointment to be scheduled for furthur evaluation of dysphagia.

## 2017-03-08 NOTE — Progress Notes (Signed)
Dental advisory given to [place] and oriented x3, pleased with MAC, report to RN Opal Sidles

## 2017-03-08 NOTE — Progress Notes (Signed)
Pt. Assessed prior to discharge.  States pain has decreased. Sets at level at 6.  Dr. Silverio Decamp talked with pt. Prior to discharge. Advised to get meds at pharmacy and take as directed.

## 2017-03-08 NOTE — Progress Notes (Signed)
Preparing to discharge pt.  She complains pain mid chest that radiates through to her "spine".  No difficulty breathing or pain elsewhere.  Skin warm and dry. States her pain is at 8 and is aching.  Will notify Dr. Silverio Decamp.

## 2017-03-08 NOTE — Progress Notes (Signed)
Called to room to assist during endoscopic procedure.  Patient ID and intended procedure confirmed with present staff. Received instructions for my participation in the procedure from the performing physician.  

## 2017-03-08 NOTE — Op Note (Addendum)
[place] Patient Name: [name] Procedure Date: 03/08/2017 2:09 PM MRN: [id number] Endoscopist: Mauri Pole , MD Age: 41 Referring MD:  Date of Birth: 23-Dec-1975 Gender: Female Account #: [id number] Procedure:                Upper GI endoscopy Indications:              Dysphagia, Esophageal reflux symptoms that persist                            despite appropriate therapy Medicines:                Monitored Anesthesia Care Procedure:                Pre-Anesthesia Assessment:                           - Prior to the procedure, a History and Physical                            was performed, and patient medications and                            allergies were reviewed. The patient's tolerance of                            previous anesthesia was also reviewed. The risks                            and benefits of the procedure and the sedation                            options and risks were discussed with the patient.                            All questions were answered, and informed consent                            was obtained. Prior Anticoagulants: The patient has                            taken no previous anticoagulant or antiplatelet                            agents. ASA Grade Assessment: III - A patient with                            severe systemic disease. After reviewing the risks                            and benefits, the patient was deemed in                            satisfactory condition to undergo the procedure.  After obtaining informed consent, the endoscope was                            passed under direct vision. Throughout the                            procedure, the patient's blood pressure, pulse, and                            oxygen saturations were monitored continuously. The                            Model GIF-HQ190 [id number]) scope was introduced                            through the  mouth, and advanced to the second part                            of duodenum. The upper GI endoscopy was                            accomplished without difficulty. The patient                            tolerated the procedure well. Scope In: Scope Out: Findings:                 The examined esophagus was normal. Biopsies were                            obtained from the proximal and distal esophagus                            with cold forceps for histology of suspected                            eosinophilic esophagitis.                           The stomach was normal.                           The examined duodenum was normal. Complications:            No immediate complications. Estimated Blood Loss:     Estimated blood loss was minimal. Impression:               - Normal esophagus. Biopsied.                           - Normal stomach.                           - Normal examined duodenum. Recommendation:           - Patient has a contact number available for  emergencies. The signs and symptoms of potential                            delayed complications were discussed with the                            patient. Return to normal activities tomorrow.                            Written discharge instructions were provided to the                            patient.                           - Resume previous diet.                           - Continue present medications.                           - Await pathology results.                           - Omeprazole (liquid) 40mg  daily, 30 minutes before                            breakfast X 30 days with 11 refills                           - Zantac 150mg  daily at bedtime X30 days with 11                            refills                           - Perform routine esophageal manometry at                            appointment to be scheduled for further evaluation                            of  dysphagia. Mauri Pole, MD 03/08/2017 2:35:30 PM This report has been signed electronically.

## 2017-03-09 ENCOUNTER — Telehealth: Payer: Self-pay | Admitting: *Deleted

## 2017-03-09 NOTE — Telephone Encounter (Signed)
  Follow up Call-  Call back number 03/08/2017  Post procedure Call Back phone  # [phone]  Permission to leave phone message Yes  Some recent data might be hidden     Patient questions:  Do you have a fever, pain , or abdominal swelling? No. Pain Score  0 *  Have you tolerated food without any problems? Yes.    Have you been able to return to your normal activities? Yes.    Do you have any questions about your discharge instructions: Diet   No. Medications  No. Follow up visit  No.  Do you have questions or concerns about your Care? No.  Actions: * If pain score is 4 or above: No action needed, pain <4.

## 2017-03-15 ENCOUNTER — Ambulatory Visit (INDEPENDENT_AMBULATORY_CARE_PROVIDER_SITE_OTHER): Payer: Medicaid Other | Admitting: Neurology

## 2017-03-15 ENCOUNTER — Encounter: Payer: Self-pay | Admitting: Neurology

## 2017-03-15 VITALS — BP 153/94 | HR 64 | Ht 65.5 in | Wt 277.0 lb

## 2017-03-15 DIAGNOSIS — M545 Low back pain: Secondary | ICD-10-CM | POA: Diagnosis not present

## 2017-03-15 DIAGNOSIS — G8929 Other chronic pain: Secondary | ICD-10-CM | POA: Diagnosis not present

## 2017-03-15 DIAGNOSIS — G43009 Migraine without aura, not intractable, without status migrainosus: Secondary | ICD-10-CM

## 2017-03-15 MED ORDER — CYCLOBENZAPRINE HCL 10 MG PO TABS: 10.0000 mg | ORAL_TABLET | Freq: Three times a day (TID) | ORAL | 3 refills | Status: AC | PRN

## 2017-03-15 NOTE — Progress Notes (Signed)
Chief Complaint  Patient presents with  . Migraine    Reports she seldom gets a migraine.  Naproxen works well to relieve her pain.  . Back Pain    She had pain across her lower back and numbness in the toes of her right foot.  She has a lumbar MRI on 01/24/17 through [facility].  . Neck Pain    She has neck pain radiating into her right shoulder that causes her arm to feel weak.      PATIENT: [name] DOB: Jul 13, 1976  Chief Complaint  Patient presents with  . Migraine    Reports she seldom gets a migraine.  Naproxen works well to relieve her pain.  . Back Pain    She had pain across her lower back and numbness in the toes of her right foot.  She has a lumbar MRI on 01/24/17 through [facility].  . Neck Pain    She has neck pain radiating into her right shoulder that causes her arm to feel weak.     HISTORICAL  [name] is a 41 years old right-handed female, seen in refer by  her primary care physician Dr. 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  Since 2014, she noticed constellation of complaints, she feels tired fatigue, excessive sleepiness all the time, has difficulty sleeping at nighttime,nod off during the daytime, even during driving, she feels like her eye blinking time was too long, often woke up startled that she was too close to the vehicle in front of her, she felt overwhelmingly fatigued, she has not had seizure like activity, today's ESS score is 16  She also complains of difficulty concentrating, difficulty remembering, sometimes forgot what kind of vehicle she was driving,   She has chronic migraines, over-the-counter Excedrin Migraine only help her temporarily  UPDATE Sep 17 2015: I have personally reviewed MRI of the brain December 2016 that was normal, she had one major headaches since last visit lasting for 3 days she  has tried over-the-counter Aleve, without helping, her headache is associated with noise sensitivity, dizziness,  Update March 15 2017: Had sleep study in 2017 there was no evidence of obstructive sleep apnea, Migraine headache: Is under good control, about once or twice each months, naproxen as needed has been helpful Chronic Low back pain:  She complains of chronic low back pain since 2016, occasionally radiating pain to right lower extremity, gait abnormality because of the pain,  MRI report from [facility] on 829 2018, multilevel degenerative disc disease, most severe at L3-4, L5-S1, borderline left foraminal stenosis, no evidence of canal stenosis or nerve root compression.  She has tried naproxen, Mobic, Robaxin with limited result, also tried physical therapy,  REVIEW OF SYSTEMS: Full 14 system review of systems performed and notable only for weight gain, swelling legs, trouble swallowing, constipation, joint pain, achy muscles, headache, numbness, weakness, insomnia difficulty swallowing anxiety not enough sleep  ALLERGIES: Allergies  Allergen Reactions  . Ciprofloxacin Shortness Of Breath, Diarrhea and Other (See Comments)    numbness  . Metronidazole Shortness Of Breath, Diarrhea and Other (See Comments)    numbness  . Shellfish-Derived Products Anaphylaxis  . Toradol [Ketorolac Tromethamine] Anaphylaxis and Hives  . Eggs Or Egg-Derived Products     vomiting  . Ketorolac   . Sulfur   . Amoxicillin Nausea And Vomiting  . Influenza Vaccines  Nausea And Vomiting    HOME MEDICATIONS: Current Outpatient Prescriptions  Medication Sig Dispense Refill  . albuterol (PROVENTIL HFA;VENTOLIN HFA) 108 (90 BASE) MCG/ACT inhaler Inhale 1-2 puffs into the lungs every 6 (six) hours as needed for wheezing or shortness of breath. 1 Inhaler 0  . amLODipine (NORVASC) 5 MG tablet Take 1 tablet by mouth daily. Has not started yet  3  . hydrochlorothiazide (HYDRODIURIL) 25 MG tablet Take 25  mg by mouth daily.    . hydroxychloroquine (PLAQUENIL) 200 MG tablet Take 1 tablet by mouth 2 (two) times daily.    Marland Kitchen linaclotide (LINZESS) 145 MCG CAPS capsule Take 1 capsule (145 mcg total) by mouth daily before breakfast. 30 capsule 3  . metFORMIN (GLUCOPHAGE) 500 MG tablet Take 500 mg by mouth 3 (three) times daily.    . methocarbamol (ROBAXIN) 500 MG tablet Take 500 mg by mouth.    . methyldopa (ALDOMET) 250 MG tablet Take 250 mg by mouth 3 (three) times daily.    . naproxen (NAPROSYN) 500 MG tablet Take 1 tablet by mouth daily.    Marland Kitchen omeprazole (PRILOSEC) 2 mg/mL SUSP Take 30 minutes before breakfast 600 mL 11  . ranitidine (ZANTAC) 150 MG/10ML syrup Take 10 mLs (150 mg total) by mouth at bedtime. 300 mL 11  . sertraline (ZOLOFT) 50 MG tablet Take 1 tablet by mouth daily.    . traMADol (ULTRAM) 50 MG tablet Take 50 mg by mouth.     Current Facility-Administered Medications  Medication Dose Route Frequency Provider Last Rate Last Dose  . 0.9 %  sodium chloride infusion  500 mL Intravenous Continuous Nandigam, Venia Minks, MD        PAST MEDICAL HISTORY: Past Medical History:  Diagnosis Date  . Anal fissure   . Anxiety   . Asthma   . Back pain   . Bronchitis   . Chronic headaches   . Complication of anesthesia    Slow to arouse after anesthesia  . Diabetes mellitus without complication ([place])   . Fibroid   . Hypertension   . Lupus   . Neck pain   . Obesity   . Ovarian cyst     PAST SURGICAL HISTORY: Past Surgical History:  Procedure Laterality Date  . APPENDECTOMY    . BREAST LUMPECTOMY     right breast  . DILATION AND CURETTAGE OF UTERUS    . OVARIAN CYST REMOVAL    . WISDOM TOOTH EXTRACTION      FAMILY HISTORY: Family History  Problem Relation Age of Onset  . Hypertension Mother   . Diabetes Father   . Hypertension Father   . Stroke Father   . Heart attack Father   . Aneurysm Paternal Aunt   . Schizophrenia Daughter   . Anxiety disorder Daughter   . ADD /  ADHD Daughter   . Heart murmur Daughter   . Sleep disorder Daughter   . Colon cancer Neg Hx     SOCIAL HISTORY:  Social History   Social History  . Marital status: Single    Spouse name: N/A  . Number of children: 1  . Years of education: in college now   Occupational History  . 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 Smoking status: Never Smoker  . Smokeless tobacco: Never Used  . Alcohol use No     Comment: occ  . Drug use: No  . Sexual activity: Yes    Birth control/  protection: None   Other Topics Concern  . Not on file   Social History Narrative   Patent drinks two cans of soda per day.   Patient is right handed.    Lives at home with her daughter and a friend.     PHYSICAL EXAM   Vitals:   03/15/17 1115  BP: (!) 153/94  Pulse: 64  Weight: 277 lb (125.6 kg)  Height: 5' 5.5" (1.664 m)    Not recorded      Body mass index is 45.39 kg/m.  PHYSICAL EXAMNIATION:  Gen: NAD, conversant, well nourised, obese, well groomed                     Cardiovascular: Regular rate rhythm, no peripheral edema, warm, nontender. Eyes: Conjunctivae clear without exudates or hemorrhage Neck: Supple, no carotid bruise. Pulmonary: Clear to auscultation bilaterally   NEUROLOGICAL EXAM:  MENTAL STATUS: Speech:    Speech is normal; fluent and spontaneous with normal comprehension.  Cognition:     Orientation to time, place and person     Normal recent and remote memory     Normal Attention span and concentration     Normal Language, naming, repeating,spontaneous speech     Fund of knowledge   CRANIAL NERVES: CN II: Visual fields are full to confrontation. Fundoscopic exam is normal with sharp discs and no vascular changes. Pupils are round equal and briskly reactive to light. CN III, IV, VI: extraocular movement are normal. No ptosis. CN V: Facial sensation is intact to pinprick in all 3 divisions bilaterally. Corneal responses are intact.    CN VII: Face is symmetric with normal eye closure and smile. CN VIII: Hearing is normal to rubbing fingers CN IX, X: Palate elevates symmetrically. Phonation is normal. CN XI: Head turning and shoulder shrug are intact CN XII: Tongue is midline with normal movements and no atrophy.  MOTOR: There is no pronator drift of out-stretched arms. Muscle bulk and tone are normal. Muscle strength is normal.  REFLEXES: Reflexes are 2+ and symmetric at the biceps, triceps, knees, and ankles. Plantar responses are flexor.  SENSORY: Intact to light touch, pinprick, position sense, and vibration sense are intact in fingers and toes.  COORDINATION: Rapid alternating movements and fine finger movements are intact. There is no dysmetria on finger-to-nose and heel-knee-shin.    GAIT/STANCE: Mildly antalgic due to low back pain, cautious, steady.  DIAGNOSTIC DATA (LABS, IMAGING, TESTING) - I reviewed patient records, labs, notes, testing and imaging myself where available.  ASSESSMENT AND PLAN  [name] is a 41 y.o. female   Chronic migraine  Imitrex 50 mg as needed  Low back pain  No evidence of lumbar radiculopathy, most consistent with musculoskeletal etiology  I have suggested back stretching exercise, heating pad, when necessary NSAIDs       Marcial Pacas, M.D. Ph.D.  [facility] [address], [place], [place] [address] Ph: [phone] Fax: [phone]  CC: Bernerd Limbo, MD

## 2017-03-17 ENCOUNTER — Encounter: Payer: Self-pay | Admitting: Gastroenterology

## 2017-03-20 ENCOUNTER — Other Ambulatory Visit: Payer: Self-pay

## 2017-03-20 ENCOUNTER — Telehealth: Payer: Self-pay

## 2017-03-20 DIAGNOSIS — R1310 Dysphagia, unspecified: Secondary | ICD-10-CM

## 2017-03-20 DIAGNOSIS — R1319 Other dysphagia: Secondary | ICD-10-CM

## 2017-03-20 MED ORDER — RANITIDINE HCL 150 MG PO TABS: 150.0000 mg | ORAL_TABLET | Freq: Every day | ORAL | 11 refills | Status: DC

## 2017-03-20 NOTE — Telephone Encounter (Signed)
Patient is unable to schedule at this time. She is enrolling in college. She does not know her class schedule. She may also have to reschedule her office visit if the class schedule conflicts. She has not been able to "stomach" the liquid medications. The taste aversion causes her to gag and regurgitate the medication. She has gone back to using the omeprazole capsule granules. Asks for a prescription for Ranitadine 150 tables to do the same.

## 2017-03-20 NOTE — Telephone Encounter (Signed)
Per procedure note "perform routine esophageal manometry at appointment to be scheduled for further evaluation of dysphagia." She is also on PPI and H2 blocker.  Left a message for the patient to return the call to discuss this.

## 2017-03-26 NOTE — Telephone Encounter (Signed)
Ok. Please send Rx for Ranitidine 150mg  daily X 30 days with 11 refills

## 2017-04-27 ENCOUNTER — Ambulatory Visit: Payer: Medicaid Other | Admitting: Gastroenterology

## 2018-02-14 DIAGNOSIS — F419 Anxiety disorder, unspecified: Secondary | ICD-10-CM | POA: Insufficient documentation

## 2018-02-14 DIAGNOSIS — M329 Systemic lupus erythematosus, unspecified: Secondary | ICD-10-CM | POA: Insufficient documentation

## 2018-09-27 ENCOUNTER — Other Ambulatory Visit (HOSPITAL_COMMUNITY): Payer: Self-pay | Admitting: Orthopaedic Surgery

## 2018-09-27 DIAGNOSIS — I739 Peripheral vascular disease, unspecified: Secondary | ICD-10-CM

## 2018-10-03 ENCOUNTER — Ambulatory Visit (HOSPITAL_COMMUNITY)
Admission: RE | Admit: 2018-10-03 | Discharge: 2018-10-03 | Disposition: A | Discharge status: Home / Self Care | Payer: Medicaid Other | Source: Ambulatory Visit | Attending: Orthopaedic Surgery | Admitting: Orthopaedic Surgery

## 2018-10-03 DIAGNOSIS — M79604 Pain in right leg: Secondary | ICD-10-CM | POA: Diagnosis not present

## 2018-10-03 DIAGNOSIS — I739 Peripheral vascular disease, unspecified: Secondary | ICD-10-CM | POA: Diagnosis not present

## 2018-10-03 NOTE — Progress Notes (Signed)
ABI has been completed.   Preliminary results in CV Proc.   [name] 10/03/2018 10:13 AM

## 2018-12-27 NOTE — Telephone Encounter (Signed)
I called and spoke to patient and she is aware that this visit has been canceled and that I will call her in a few weeks to reschedule.

## 2018-12-28 ENCOUNTER — Encounter: Payer: Medicaid Other | Admitting: Neurology

## 2018-12-28 ENCOUNTER — Encounter

## 2019-02-27 ENCOUNTER — Ambulatory Visit (INDEPENDENT_AMBULATORY_CARE_PROVIDER_SITE_OTHER): Payer: Medicaid Other | Admitting: Neurology

## 2019-02-27 ENCOUNTER — Other Ambulatory Visit: Payer: Self-pay

## 2019-02-27 ENCOUNTER — Encounter: Payer: Medicaid Other | Admitting: Neurology

## 2019-02-27 DIAGNOSIS — M79606 Pain in leg, unspecified: Secondary | ICD-10-CM

## 2019-02-27 DIAGNOSIS — M79605 Pain in left leg: Secondary | ICD-10-CM

## 2019-02-27 DIAGNOSIS — M79604 Pain in right leg: Secondary | ICD-10-CM | POA: Diagnosis not present

## 2019-02-27 DIAGNOSIS — Z0289 Encounter for other administrative examinations: Secondary | ICD-10-CM

## 2019-02-27 NOTE — Procedures (Addendum)
Full Name: Vianne Grieshop Gender: Female MRN #: [id number] Date of Birth: 15-Mar-1976    Visit Date: 02/27/2019 10:38 Age: 43 Years 79 Months Old Examining Physician: Marcial Pacas, MD  Referring Physician: Marella Chimes, PA History:   43 years old female presented with bilateral leg pain.  Summary of the tests:  Nerve conduction Studies: Bilateral sural, superficial peroneal sensory responses were normal.  Bilateral peroneal to EDB and tibial motor responses were normal  Electromyography: Selected needle examinations of bilateral lower extremity muscles were normal.   Conclusion: This is a normal study.  There is NO electrodiagnostic evidence of large fiber peripheral neuropathy or bilateral lumbosacral radiculopathy.  There is NO evidence of inflammatory neuropathic changes.    ------------------------------- Marcial Pacas, M.D.Ph.D.  [facility] [place], [place] [address] Tel: [phone] Fax: [phone]        [facility]    Nerve / Sites Muscle Latency Ref. Amplitude Ref. Rel Amp Segments Distance Velocity Ref. Area    ms ms mV mV %  cm m/s m/s mVms  R Peroneal - EDB     Ankle EDB 3.8 ?6.5 9.5 ?2.0 100 Ankle - EDB 9   23.4     Fib head EDB 9.4  8.1  84.7 Fib head - Ankle 28 50 ?44 20.3     Pop fossa EDB 11.4  8.0  99.1 Pop fossa - Fib head 10 49 ?44 21.1         Pop fossa - Ankle      L Peroneal - EDB     Ankle EDB 3.7 ?6.5 10.9 ?2.0 100 Ankle - EDB 9   27.4     Fib head EDB 9.3  9.3  85.5 Fib head - Ankle 29 52 ?44 24.6     Pop fossa EDB 10.9  9.3  100 Pop fossa - Fib head 8 51 ?44 25.2         Pop fossa - Ankle      R Tibial - AH     Ankle AH 3.4 ?5.8 8.0 ?4.0 100 Ankle - AH 9   14.9     Pop fossa AH 11.4  5.0  63.2 Pop fossa - Ankle 34 43 ?41 11.3  L Tibial - AH     Ankle AH 3.1 ?5.8 8.6 ?4.0 100 Ankle - AH 9   17.9     Pop fossa AH 11.3  5.3  61.7 Pop fossa - Ankle 35 43 ?41 12.9             SNC    Nerve / Sites Rec. Site Peak  Lat Ref.  Amp Ref. Segments Distance    ms ms V V  cm  R Sural - Ankle (Calf)     Calf Ankle 3.1 ?4.4 6 ?6 Calf - Ankle 14  L Sural - Ankle (Calf)     Calf Ankle 3.5 ?4.4 5 ?6 Calf - Ankle 14  R Superficial peroneal - Ankle     Lat leg Ankle 3.8 ?4.4 6 ?6 Lat leg - Ankle 14  L Superficial peroneal - Ankle     Lat leg Ankle 3.6 ?4.4 6 ?6 Lat leg - Ankle 14              F  Wave    Nerve F Lat Ref.   ms ms  R Tibial - AH 48.7 ?56.0  L Tibial - AH 47.0 ?56.0  EMG       EMG Summary Table    Spontaneous MUAP Recruitment  Muscle IA Fib PSW Fasc Other Amp Dur. Poly Pattern  R. Tibialis anterior Normal None None None _______ Normal Normal Normal Normal  R. Tibialis posterior Normal None None None _______ Normal Normal Normal Normal  R. Peroneus longus Normal None None None _______ Normal Normal Normal Normal  R. Gastrocnemius (Medial head) Normal None None None _______ Normal Normal Normal Normal  R. Vastus lateralis Normal None None None _______ Normal Normal Normal Normal  L. Tibialis anterior Normal None None None _______ Normal Normal Normal Normal  L. Tibialis posterior Normal None None None _______ Normal Normal Normal Normal  L. Gastrocnemius (Medial head) Normal None None None _______ Normal Normal Normal Normal  L. Peroneus longus Normal None None None _______ Normal Normal Normal Normal  L. Vastus lateralis Normal None None None _______ Normal Normal Normal Normal

## 2019-02-28 ENCOUNTER — Encounter (HOSPITAL_COMMUNITY): Payer: Self-pay | Admitting: Emergency Medicine

## 2019-02-28 ENCOUNTER — Other Ambulatory Visit: Payer: Self-pay

## 2019-02-28 ENCOUNTER — Emergency Department (HOSPITAL_COMMUNITY)
Admission: EM | Admit: 2019-02-28 | Discharge: 2019-02-28 | Disposition: A | Discharge status: Home / Self Care | Payer: Medicaid Other | Attending: Emergency Medicine | Admitting: Emergency Medicine

## 2019-02-28 DIAGNOSIS — J392 Other diseases of pharynx: Secondary | ICD-10-CM | POA: Diagnosis present

## 2019-02-28 DIAGNOSIS — Z79899 Other long term (current) drug therapy: Secondary | ICD-10-CM | POA: Diagnosis not present

## 2019-02-28 DIAGNOSIS — E119 Type 2 diabetes mellitus without complications: Secondary | ICD-10-CM | POA: Diagnosis not present

## 2019-02-28 DIAGNOSIS — I10 Essential (primary) hypertension: Secondary | ICD-10-CM | POA: Diagnosis not present

## 2019-02-28 DIAGNOSIS — Z7984 Long term (current) use of oral hypoglycemic drugs: Secondary | ICD-10-CM | POA: Diagnosis not present

## 2019-02-28 DIAGNOSIS — J45909 Unspecified asthma, uncomplicated: Secondary | ICD-10-CM | POA: Insufficient documentation

## 2019-02-28 NOTE — Discharge Instructions (Signed)
You have been seen today for food stuck in throat and throat irritation. Please read and follow all provided instructions.   1. Medications: usual home medications 2. Treatment: rest, drink plenty of fluids 3. Follow Up: Please follow up with your primary doctor in 7 days for discussion of your diagnoses and further evaluation after today's visit; if you do not have a primary care doctor use the resource guide provided to find one; Please return to the ER for any new or worsening symptoms. Please obtain all of your results from medical records or have your doctors office obtain the results - share them with your doctor - you should be seen at your doctors office. Call today to arrange your follow up.  ?  You should return to the ER if you develop severe or worsening symptoms.   Emergency Department Resource Guide 1) Find a Doctor and Pay Out of Pocket Although you won't have to find out who is covered by your insurance plan, it is a good idea to ask around and get recommendations. You will then need to call the office and see if the doctor you have chosen will accept you as a new patient and what types of options they offer for patients who are self-pay. Some doctors offer discounts or will set up payment plans for their patients who do not have insurance, but you will need to ask so you aren't surprised when you get to your appointment.  2) Contact Your Local Health Department Not all health departments have doctors that can see patients for sick visits, but many do, so it is worth a call to see if yours does. If you don't know where your local health department is, you can check in your phone book. The CDC also has a tool to help you locate your state's health department, and many state websites also have listings of all of their local health departments.  3) Find a [facility] [place] [facility] If your illness is not likely to be very severe or complicated, you may want to try a walk in clinic. These are popping  up all over the country in pharmacies, drugstores, and shopping centers. They're usually staffed by nurse practitioners or physician assistants that have been trained to treat common illnesses and complaints. They're usually fairly quick and inexpensive. However, if you have serious medical issues or chronic medical problems, these are probably not your best option.  No Primary Care Doctor: Call Health Connect at  [phone] - they can help you locate a primary care doctor that  accepts your insurance, provides certain services, etc. Physician Referral Service- [phone]  Chronic Pain Problems: Organization         Address  Phone   Notes  [facility]  [phone] Patients need to be referred by their primary care doctor.   Medication Assistance: Organization         Address  Phone   Notes  [facility] Medication [facility] [address] [place], [place] [address] [phone] --Must be a resident of [facility] -- Must have NO insurance coverage whatsoever (no Medicaid/ Medicare, etc.) -- The pt. MUST have a primary care doctor that directs their care regularly and follows them in the community   [facility]  [phone]   [employer]  [phone]    Agencies that provide inexpensive medical care: Organization         Address  Phone   Notes  [facility]  [phone]   [facility] Internal Medicine    [phone]   [facility] [place], [place] [address] [phone]   [place] [address], [place] [phone]   [facility]    [phone]   [facility]    [phone]   [place] and [place] [address], [place] Phone:  [phone], Fax:  [phone] Hours of Operation:  9 am - 6 pm, M-F.  Also accepts Medicaid/Medicare and self-pay.  [facility] for [place] [address], [address], [place] Phone: [phone], Fax: [phone]. Hours of Operation:  8:30 am - 5:30 pm, M-F.  Also accepts Medicaid and self-pay.  [facility] [address] [place] Phone: [phone]   [place], [place], [place] [phone], Ext. [phone] Mondays & Thursdays: 7-9 AM.  First 15 patients are seen on a first come, first serve basis.    [place] Providers:  Organization         Address  Phone   Notes  [facility] [address], [place] [phone] Also accepts self-pay patients.  [facility] [address], [place]  [phone]   [place], [address], [place] [phone]   [facility] [address] [place] [phone]   Lucianne Lei [address], [place]   [phone] Only accepts [place] [facility] [place] patients after they have their name applied to their card.   Self-Pay (no insurance) in [facility]:  Organization         Address  Phone   Notes  Sickle Cell Patients, [facility] Internal Medicine [place] [phone]   [facility] [place] [phone]   [facility] [place]  [place], [place], [place] [phone]   [facility]/Dr. Osei-Bonsu  [address] [place] or [address], [place] [phone] Phone number for both [place] and [place] locations is the same.  [facility] and [facility] [address], [place] [phone]   [facility] [address] [place] or [address] [phone]   [facility] [address], [place] [phone], phone; [phone], fax Sees patients 1st and 3rd Saturday of every  month.  Must not qualify for public or private insurance (i.e. Medicaid, Medicare, [place] Health Choice, Veterans' Benefits)  Household income should be no more than 200% of the poverty level The clinic cannot treat you if you are pregnant or think you are pregnant  Sexually transmitted diseases are not treated at the clinic.

## 2019-02-28 NOTE — ED Triage Notes (Signed)
Pt reports while ago was eating tacos and stem of cilantro got stuck in throat. Went to the bathroom and tried to throw it up. Pt able to speak in full sentences and breathe without any distress.

## 2019-02-28 NOTE — ED Provider Notes (Signed)
[facility] DEPT Provider Note   CSN: [id number] Arrival date & time: 02/28/19  1640    History   Chief Complaint Chief Complaint  Patient presents with  . food stuck in throat    HPI [name] is a 43 y.o. female with a PMH of Anxiety, Asthma, Lupus, and chronic headaches presenting due to concerns about having food stuck in her throat. Patient reports she was eating tacos approximately 15 minutes ago. Patient reports she believes a cilantro stem was stuck in her throat. Patient states she had an episode of voluntary vomiting with relief of her symptoms. Patient denies any symptoms currently. Patient denies shortness of breath, current nausea, or abdominal pain. Patient denies sore throat, trouble swallowing, voice change, or neck pain. Patient denies fever, cough, chills, sick exposures, or recent travel. Patient reports feeling anxious about the incident.      HPI  Past Medical History:  Diagnosis Date  . Anal fissure   . Anxiety   . Asthma   . Back pain   . Bronchitis   . Chronic headaches   . Complication of anesthesia    Slow to arouse after anesthesia  . Diabetes mellitus without complication ([place])   . Fibroid   . Hypertension   . Lupus ([place])   . Neck pain   . Obesity   . Ovarian cyst     Patient Active Problem List   Diagnosis Date Noted  . Low back pain 09/17/2015  . Abnormal laboratory test 08/03/2015  . Fatigue 08/03/2015  . Excessive sleepiness 07/30/2015  . Migraine 07/30/2015  . Memory loss 07/30/2015  . Dysphagia 06/29/2015    Past Surgical History:  Procedure Laterality Date  . APPENDECTOMY    . BREAST LUMPECTOMY     right breast  . DILATION AND CURETTAGE OF UTERUS    . OVARIAN CYST REMOVAL    . WISDOM TOOTH EXTRACTION       OB History    Gravida  3   Para  1   Term  1   Preterm      AB  2   Living  1     SAB  2   TAB      Ectopic      Multiple      Live Births  1             Home Medications    Prior to Admission medications   Medication Sig Start Date End Date Taking? Authorizing Provider  albuterol (PROVENTIL HFA;VENTOLIN HFA) 108 (90 BASE) MCG/ACT inhaler Inhale 1-2 puffs into the lungs every 6 (six) hours as needed for wheezing or shortness of breath. 05/05/14   Debby Freiberg, MD  amLODipine (NORVASC) 5 MG tablet Take 1 tablet by mouth daily. Has not started yet 12/29/16   [provider]  cyclobenzaprine (FLEXERIL) 10 MG tablet Take 1 tablet (10 mg total) by mouth 3 (three) times daily as needed for muscle spasms. 03/15/17   Marcial Pacas, MD  hydrochlorothiazide (HYDRODIURIL) 25 MG tablet Take 25 mg by mouth daily.    [provider]  hydroxychloroquine (PLAQUENIL) 200 MG tablet Take 1 tablet by mouth 2 (two) times daily. 11/17/15   [provider]  linaclotide Rolan Lipa) 145 MCG CAPS capsule Take 1 capsule (145 mcg total) by mouth daily before breakfast. 03/11/16   Mauri Pole, MD  metFORMIN (GLUCOPHAGE) 500 MG tablet Take 500 mg by mouth 3 (three) times daily. 05/31/15   [provider]  methyldopa (ALDOMET) 250 MG tablet Take 250 mg by mouth 3 (three) times daily. 06/01/15   [provider]  naproxen (NAPROSYN) 500 MG tablet Take 1 tablet by mouth daily. 12/16/15   [provider]  omeprazole (PRILOSEC) 2 mg/mL SUSP Take 30 minutes before breakfast 03/08/17   Mauri Pole, MD  ranitidine (ZANTAC) 150 MG tablet Take 1 tablet (150 mg total) by mouth at bedtime. 03/20/17   Mauri Pole, MD  sertraline (ZOLOFT) 50 MG tablet Take 1 tablet by mouth daily. 12/10/15   [provider]  traMADol (ULTRAM) 50 MG tablet Take 50 mg by mouth. 09/10/15   [provider]    Family History Family History  Problem Relation Age of Onset  . Hypertension Mother   . Diabetes Father   . Hypertension Father   . Stroke Father   . Heart attack Father   . Aneurysm Paternal Aunt   . Schizophrenia  Daughter   . Anxiety disorder Daughter   . ADD / ADHD Daughter   . Heart murmur Daughter   . Sleep disorder Daughter   . Colon cancer Neg Hx     Social History Social History   Tobacco Use  . Smoking status: Never Smoker  . Smokeless tobacco: Never Used  Substance Use Topics  . Alcohol use: No    Comment: occ  . Drug use: No     Allergies   Ciprofloxacin, Metronidazole, Shellfish-derived products, Toradol [ketorolac tromethamine], Eggs or egg-derived products, Ketorolac, Sulfur, Amoxicillin, and Influenza vaccines   Review of Systems Review of Systems  Constitutional: Negative for chills, diaphoresis and fever.  HENT: Negative for congestion, ear pain, facial swelling, sore throat, trouble swallowing and voice change.        Patient reports she had a sensation that food was stuck in her throat.  Respiratory: Negative for cough and shortness of breath.   Cardiovascular: Negative for chest pain.  Gastrointestinal: Negative for abdominal pain, nausea and vomiting.  Endocrine: Negative for cold intolerance and heat intolerance.  Genitourinary: Negative for dysuria.  Musculoskeletal: Negative for back pain, neck pain and neck stiffness.  Skin: Negative for rash.  Allergic/Immunologic: Negative for immunocompromised state.  Hematological: Negative for adenopathy.  Psychiatric/Behavioral: The patient is nervous/anxious.      Physical Exam Updated Vital Signs BP (!) 146/95   Pulse 91   Temp 98.4 F (36.9 C) (Oral)   Resp 16   LMP 01/31/2019   SpO2 100%   Physical Exam Vitals signs and nursing note reviewed.  Constitutional:      General: She is not in acute distress.    Appearance: She is well-developed. She is not diaphoretic.     Comments: Patient is laughing and joking throughout exam.  HENT:     Head: Normocephalic and atraumatic.     Comments: Patient is speaking in full sentences without difficulty.     Right Ear: Tympanic membrane, ear canal and external  ear normal.     Left Ear: Tympanic membrane, ear canal and external ear normal.     Nose: Nose normal. No congestion or rhinorrhea.     Mouth/Throat:     Mouth: Mucous membranes are moist.     Pharynx: No oropharyngeal exudate or posterior oropharyngeal erythema.     Comments: No visible foreign object identified.  Eyes:     Extraocular Movements: Extraocular movements intact.     Conjunctiva/sclera: Conjunctivae normal.     Pupils: Pupils are equal, round,  and reactive to light.  Neck:     Musculoskeletal: Normal range of motion and neck supple.     Comments: Full ROM of neck without difficulty. Cardiovascular:     Rate and Rhythm: Normal rate and regular rhythm.     Heart sounds: Normal heart sounds. No murmur. No friction rub. No gallop.   Pulmonary:     Effort: Pulmonary effort is normal. No respiratory distress.     Breath sounds: Normal breath sounds. No wheezing or rales.  Abdominal:     Palpations: Abdomen is soft.     Tenderness: There is no abdominal tenderness.  Musculoskeletal: Normal range of motion.  Skin:    General: Skin is warm.     Findings: No erythema or rash.  Neurological:     Mental Status: She is alert.  Psychiatric:        Mood and Affect: Mood is anxious.      ED Treatments / Results  Labs (all labs ordered are listed, but only abnormal results are displayed) Labs Reviewed - No data to display  EKG None  Radiology No results found.  Procedures Procedures (including critical care time)  Medications Ordered in ED Medications - No data to display   Initial Impression / Assessment and Plan / ED Course  I have reviewed the triage vital signs and the nursing notes.  Pertinent labs & imaging results that were available during my care of the patient were reviewed by me and considered in my medical decision making (see chart for details).  Clinical Course as of Feb 27 1826  Thu Feb 28, 2019  1822 Patient states she is still able to drink  fluids, talk, and breathe without difficulty. Patient denies symptoms currently. Patient is comfortable with being discharged at this time.   [AH]    Clinical Course User Index [AH] Arville Lime, PA-C      Patient presents with throat irritation after eating. Symptoms have resolved while in the ER. Patient is able to drink fluids without difficulty. Patient is speaking in full sentences without difficulty. Patient has been monitored without symptoms. Patient is stable for discharge. Discussed return precautions. Advised patient to follow up with PCP. Patient states she understands and agrees with plan.   Final Clinical Impressions(s) / ED Diagnoses   Final diagnoses:  Throat irritation    ED Discharge Orders    None       Arville Lime, [place] 02/28/19 Melody Haver, MD 03/03/19 [phone]

## 2019-04-14 ENCOUNTER — Encounter (HOSPITAL_COMMUNITY): Payer: Self-pay | Admitting: Emergency Medicine

## 2019-04-14 ENCOUNTER — Ambulatory Visit (HOSPITAL_COMMUNITY)
Admission: EM | Admit: 2019-04-14 | Discharge: 2019-04-14 | Disposition: A | Discharge status: Home / Self Care | Payer: Medicaid Other | Attending: Family Medicine | Admitting: Family Medicine

## 2019-04-14 ENCOUNTER — Other Ambulatory Visit: Payer: Self-pay

## 2019-04-14 DIAGNOSIS — R070 Pain in throat: Secondary | ICD-10-CM | POA: Diagnosis not present

## 2019-04-14 MED ORDER — LIDOCAINE VISCOUS HCL 2 % MT SOLN: 15.0000 mL | Freq: Once | OROMUCOSAL | Status: DC

## 2019-04-14 MED ORDER — LIDOCAINE VISCOUS HCL 2 % MT SOLN
15.0000 mL | Freq: Once | OROMUCOSAL | Status: AC
  Administered 2019-04-14: 15 mL via ORAL

## 2019-04-14 MED ORDER — ALUM & MAG HYDROXIDE-SIMETH 200-200-20 MG/5ML PO SUSP
ORAL | Status: AC
  Filled 2019-04-14: qty 30

## 2019-04-14 MED ORDER — LIDOCAINE VISCOUS HCL 2 % MT SOLN
OROMUCOSAL | Status: AC
  Filled 2019-04-14: qty 15

## 2019-04-14 MED ORDER — ALUM & MAG HYDROXIDE-SIMETH 200-200-20 MG/5ML PO SUSP
30.0000 mL | Freq: Once | ORAL | Status: AC
  Administered 2019-04-14: 30 mL via ORAL

## 2019-04-14 NOTE — Discharge Instructions (Signed)
Important to eat liquids, soft foods: Avoid dense foods such as meats, cheese, breads. Follow-up with GI. Go to ER for further evaluation if you develop worsening pain, difficulty breathing/swallowing, choking, early satiety, vomiting.

## 2019-04-14 NOTE — ED Provider Notes (Signed)
[facility]    CSN: [id number] Arrival date & time: 04/14/19  1612     History   Chief Complaint Chief Complaint  Patient presents with  . Sore Throat    HPI Alezandra Egli is a 43 y.o. female presenting for 2-week course of intermittent throat discomfort.  States she swallowed a piece of cilantro 2 weeks ago and choked on it which gave her foreign body sensation.  States this resolved, though few days ago she ate beef jerky with recurrence of symptoms.  Patient reports history of esophageal stricture; records reviewed by me show DG esophagus from August 2017 without evidence of stricture.  Patient has had work-up through GI provider, unable to review all those outside records.  Patient denies foreign body sensation, choking, difficulty breathing or swallowing, decreased appetite, vomiting, chest pain, shortness of breath.   Past Medical History:  Diagnosis Date  . Anal fissure   . Anxiety   . Asthma   . Back pain   . Bronchitis   . Chronic headaches   . Complication of anesthesia    Slow to arouse after anesthesia  . Diabetes mellitus without complication ([place])   . Fibroid   . Hypertension   . Lupus ([place])   . Neck pain   . Obesity   . Ovarian cyst     Patient Active Problem List   Diagnosis Date Noted  . Low back pain 09/17/2015  . Abnormal laboratory test 08/03/2015  . Fatigue 08/03/2015  . Excessive sleepiness 07/30/2015  . Migraine 07/30/2015  . Memory loss 07/30/2015  . Dysphagia 06/29/2015    Past Surgical History:  Procedure Laterality Date  . APPENDECTOMY    . BREAST LUMPECTOMY     right breast  . DILATION AND CURETTAGE OF UTERUS    . OVARIAN CYST REMOVAL    . WISDOM TOOTH EXTRACTION      OB History    Gravida  3   Para  1   Term  1   Preterm      AB  2   Living  1     SAB  2   TAB      Ectopic      Multiple      Live Births  1            Home Medications    Prior to Admission medications   Medication  Sig Start Date End Date Taking? Authorizing Provider  albuterol (PROVENTIL HFA;VENTOLIN HFA) 108 (90 BASE) MCG/ACT inhaler Inhale 1-2 puffs into the lungs every 6 (six) hours as needed for wheezing or shortness of breath. 05/05/14   Debby Freiberg, MD  amLODipine (NORVASC) 5 MG tablet Take 1 tablet by mouth daily. Has not started yet 12/29/16   [provider]  cyclobenzaprine (FLEXERIL) 10 MG tablet Take 1 tablet (10 mg total) by mouth 3 (three) times daily as needed for muscle spasms. 03/15/17   Marcial Pacas, MD  hydrochlorothiazide (HYDRODIURIL) 25 MG tablet Take 25 mg by mouth daily.    [provider]  hydroxychloroquine (PLAQUENIL) 200 MG tablet Take 1 tablet by mouth 2 (two) times daily. 11/17/15   [provider]  linaclotide Rolan Lipa) 145 MCG CAPS capsule Take 1 capsule (145 mcg total) by mouth daily before breakfast. 03/11/16   Mauri Pole, MD  metFORMIN (GLUCOPHAGE) 500 MG tablet Take 500 mg by mouth 3 (three) times daily. 05/31/15   [provider]  methyldopa (ALDOMET) 250 MG tablet Take 250  mg by mouth 3 (three) times daily. 06/01/15   [provider]  naproxen (NAPROSYN) 500 MG tablet Take 1 tablet by mouth daily. 12/16/15   [provider]  omeprazole (PRILOSEC) 2 mg/mL SUSP Take 30 minutes before breakfast 03/08/17   Mauri Pole, MD  ranitidine (ZANTAC) 150 MG tablet Take 1 tablet (150 mg total) by mouth at bedtime. 03/20/17   Mauri Pole, MD  sertraline (ZOLOFT) 50 MG tablet Take 1 tablet by mouth daily. 12/10/15   [provider]  traMADol (ULTRAM) 50 MG tablet Take 50 mg by mouth. 09/10/15   [provider]    Family History Family History  Problem Relation Age of Onset  . Hypertension Mother   . Diabetes Father   . Hypertension Father   . Stroke Father   . Heart attack Father   . Aneurysm Paternal Aunt   . Schizophrenia Daughter   . Anxiety disorder Daughter   . ADD / ADHD Daughter   .  Heart murmur Daughter   . Sleep disorder Daughter   . Colon cancer Neg Hx     Social History Social History   Tobacco Use  . Smoking status: Never Smoker  . Smokeless tobacco: Never Used  Substance Use Topics  . Alcohol use: No    Comment: occ  . Drug use: No     Allergies   Ciprofloxacin, Metronidazole, Shellfish-derived products, Toradol [ketorolac tromethamine], Eggs or egg-derived products, Ketorolac, Sulfur, Amoxicillin, and Influenza vaccines   Review of Systems Review of Systems  Constitutional: Negative for fatigue and fever.  HENT: Positive for sore throat. Negative for ear pain, sinus pain and voice change.   Eyes: Negative for pain, redness and visual disturbance.  Respiratory: Negative for cough and shortness of breath.   Cardiovascular: Negative for chest pain and palpitations.  Gastrointestinal: Negative for abdominal pain, diarrhea and vomiting.  Musculoskeletal: Negative for arthralgias and myalgias.  Skin: Negative for rash and wound.  Neurological: Negative for syncope and headaches.     Physical Exam Triage Vital Signs ED Triage Vitals  Enc Vitals Group     BP      Pulse      Resp      Temp      Temp src      SpO2      Weight      Height      Head Circumference      Peak Flow      Pain Score      Pain Loc      Pain Edu?      Excl. in [place]?    No data found.  Updated Vital Signs BP (!) 166/114   Pulse 84   Temp 98 F (36.7 C)   Resp 18   LMP 04/12/2019   SpO2 100%   Visual Acuity Right Eye Distance:   Left Eye Distance:   Bilateral Distance:    Right Eye Near:   Left Eye Near:    Bilateral Near:     Physical Exam Constitutional:      General: She is not in acute distress. HENT:     Head: Normocephalic and atraumatic.     Mouth/Throat:     Mouth: Mucous membranes are moist.     Pharynx: Oropharynx is clear. Uvula midline. No pharyngeal swelling, posterior oropharyngeal erythema or uvula swelling.     Tonsils: No  tonsillar exudate. 1+ on the right. 1+ on the left.  Comments: No foreign body identified Eyes:     General: No scleral icterus.    Pupils: Pupils are equal, round, and reactive to light.  Neck:     Musculoskeletal: Normal range of motion and neck supple.     Thyroid: No thyromegaly.  Cardiovascular:     Rate and Rhythm: Normal rate and regular rhythm.     Heart sounds: Normal heart sounds.     Comments: Repeat BP per provider: 165/104 Pulmonary:     Effort: Pulmonary effort is normal. No respiratory distress.     Breath sounds: No wheezing.  Lymphadenopathy:     Cervical: No cervical adenopathy.  Skin:    Coloration: Skin is not jaundiced or pale.  Neurological:     Mental Status: She is alert and oriented to person, place, and time.      UC Treatments / Results  Labs (all labs ordered are listed, but only abnormal results are displayed) Labs Reviewed - No data to display  EKG   Radiology No results found.  Procedures Procedures (including critical care time)  Medications Ordered in UC Medications  alum & mag hydroxide-simeth (MAALOX/MYLANTA) 200-200-20 MG/5ML suspension 30 mL (30 mLs Oral Given 04/14/19 1702)    And  lidocaine (XYLOCAINE) 2 % viscous mouth solution 15 mL (15 mLs Oral Given 04/14/19 1702)  alum & mag hydroxide-simeth (MAALOX/MYLANTA) 200-200-20 MG/5ML suspension (has no administration in time range)  lidocaine (XYLOCAINE) 2 % viscous mouth solution (has no administration in time range)    Initial Impression / Assessment and Plan / UC Course  I have reviewed the triage vital signs and the nursing notes.  Pertinent labs & imaging results that were available during my care of the patient were reviewed by me and considered in my medical decision making (see chart for details).     1.  Throat discomfort Reassurance provided to patient satisfaction.  Patient given oral viscous lidocaine with improvement of discomfort.  We will follow-up with her GI  provider on Monday for further evaluation the endoscopy given patient's subjective history of strictures.  Return precautions discussed, patient verbalized understanding and is agreeable to plan.  Of note, patient's BP elevated in office: Currently asymptomatic.  Did not take blood pressure medication today, will just when she gets home. Final Clinical Impressions(s) / UC Diagnoses   Final diagnoses:  Throat discomfort     Discharge Instructions     Important to eat liquids, soft foods: Avoid dense foods such as meats, cheese, breads. Follow-up with GI. Go to ER for further evaluation if you develop worsening pain, difficulty breathing/swallowing, choking, early satiety, vomiting.    ED Prescriptions    None     Controlled Substance Prescriptions [place] Controlled Substance Registry consulted? Not Applicable   Quincy Sheehan, [place] 04/14/19 1721

## 2019-04-14 NOTE — ED Triage Notes (Addendum)
Pt states two weeks ago there was a piece of cilantro stem in her taco and she choked on it, it was lodged in her throat, but eventually she was able to swallow. A few days ago she was eating beef jerky and a sharp piece also became lodged in her throat. Pt states she has hx of esophageal stricture, states around a year ago she had her throat stretched. Pt states she thinks from [place] with her throat so much trying to get the foot out, she irritated her throat.

## 2019-04-15 ENCOUNTER — Encounter (HOSPITAL_COMMUNITY): Payer: Self-pay

## 2019-04-15 ENCOUNTER — Emergency Department (HOSPITAL_COMMUNITY)
Admission: EM | Admit: 2019-04-15 | Discharge: 2019-04-15 | Disposition: A | Discharge status: Home / Self Care | Payer: Medicaid Other | Attending: Emergency Medicine | Admitting: Emergency Medicine

## 2019-04-15 DIAGNOSIS — J45909 Unspecified asthma, uncomplicated: Secondary | ICD-10-CM | POA: Diagnosis not present

## 2019-04-15 DIAGNOSIS — Z79899 Other long term (current) drug therapy: Secondary | ICD-10-CM | POA: Insufficient documentation

## 2019-04-15 DIAGNOSIS — Z7984 Long term (current) use of oral hypoglycemic drugs: Secondary | ICD-10-CM | POA: Diagnosis not present

## 2019-04-15 DIAGNOSIS — R1310 Dysphagia, unspecified: Secondary | ICD-10-CM | POA: Insufficient documentation

## 2019-04-15 DIAGNOSIS — E119 Type 2 diabetes mellitus without complications: Secondary | ICD-10-CM | POA: Insufficient documentation

## 2019-04-15 DIAGNOSIS — R070 Pain in throat: Secondary | ICD-10-CM | POA: Diagnosis present

## 2019-04-15 DIAGNOSIS — I10 Essential (primary) hypertension: Secondary | ICD-10-CM | POA: Diagnosis not present

## 2019-04-15 NOTE — Discharge Instructions (Addendum)
Someone from the office should get in touch with you to try to work you in sooner for an appointment.  Continue with the liquid and soft diet as you are doing now

## 2019-04-15 NOTE — ED Triage Notes (Addendum)
Pt states that it feels like she now has beef jerky in her throat after being seen yesterday for a similar issue. Pt was instructed by urgent care to come here. Pt has had to get her throat stretched before.

## 2019-04-15 NOTE — ED Provider Notes (Signed)
[facility] DEPT Provider Note   CSN: [id number] Arrival date & time: 04/15/19  1423    History   Chief Complaint Chief Complaint  Patient presents with  . Throat Pain    HPI [name] is a 43 y.o. female.     HPI Patient presents to the emergency room for evaluation of progressive dysphagia.  Patient states she initially noticed symptoms back July 2.  Patient was eating some tacos when she felt like she had a cilantro's get stuck in her throat.  Patient was eventually able to eat and drink and swallow fluids without difficulty.  She was discharged home.  Patient has noted some increasing symptoms over the last couple of weeks.  Patient states a few days ago she was eating beef jerky and felt like her symptoms recurred.  Patient states her symptoms are getting worse.  She is only able to eat certain foods such as yogurt and liquids but anything more solid such as even grits are difficult for her to swallow.  Patient has not had any solid food.  Patient was referred to GI.  She has an appointment later this week.  Patient called her doctor today because she continues to have difficulty.  Patient was instructed to come to the emergency room for evaluation.  Patient is not having any difficulty with her secretions.  She is not have any difficulty breathing. Past Medical History:  Diagnosis Date  . Anal fissure   . Anxiety   . Asthma   . Back pain   . Bronchitis   . Chronic headaches   . Complication of anesthesia    Slow to arouse after anesthesia  . Diabetes mellitus without complication ([place])   . Fibroid   . Hypertension   . Lupus ([place])   . Neck pain   . Obesity   . Ovarian cyst     Patient Active Problem List   Diagnosis Date Noted  . Low back pain 09/17/2015  . Abnormal laboratory test 08/03/2015  . Fatigue 08/03/2015  . Excessive sleepiness 07/30/2015  . Migraine 07/30/2015  . Memory loss 07/30/2015  . Dysphagia 06/29/2015     Past Surgical History:  Procedure Laterality Date  . APPENDECTOMY    . BREAST LUMPECTOMY     right breast  . DILATION AND CURETTAGE OF UTERUS    . OVARIAN CYST REMOVAL    . WISDOM TOOTH EXTRACTION       OB History    Gravida  3   Para  1   Term  1   Preterm      AB  2   Living  1     SAB  2   TAB      Ectopic      Multiple      Live Births  1            Home Medications    Prior to Admission medications   Medication Sig Start Date End Date Taking? Authorizing Provider  albuterol (PROVENTIL HFA;VENTOLIN HFA) 108 (90 BASE) MCG/ACT inhaler Inhale 1-2 puffs into the lungs every 6 (six) hours as needed for wheezing or shortness of breath. 05/05/14  Yes Debby Freiberg, MD  amLODipine (NORVASC) 5 MG tablet Take 1 tablet by mouth daily. Has not started yet 12/29/16  Yes [provider]  cyclobenzaprine (FLEXERIL) 10 MG tablet Take 1 tablet (10 mg total) by mouth 3 (three) times daily as needed for muscle spasms. 03/15/17  Yes  Marcial Pacas, MD  hydrochlorothiazide (HYDRODIURIL) 25 MG tablet Take 25 mg by mouth daily.   Yes [provider]  hydroxychloroquine (PLAQUENIL) 200 MG tablet Take 1 tablet by mouth 2 (two) times daily. 11/17/15  Yes [provider]  lisinopril (ZESTRIL) 10 MG tablet Take 10 mg by mouth daily. 02/15/19  Yes [provider]  metFORMIN (GLUCOPHAGE) 500 MG tablet Take 500 mg by mouth 3 (three) times daily. 05/31/15  Yes [provider]  naproxen (NAPROSYN) 500 MG tablet Take 1 tablet by mouth daily as needed for mild pain.  12/16/15  Yes [provider]  linaclotide (LINZESS) 145 MCG CAPS capsule Take 1 capsule (145 mcg total) by mouth daily before breakfast. Patient taking differently: Take 145 mcg by mouth daily as needed (constipation).  03/11/16   Mauri Pole, MD  omeprazole (PRILOSEC) 2 mg/mL SUSP Take 30 minutes before breakfast Patient not taking: Reported on 04/15/2019 03/08/17   Mauri Pole, MD  ranitidine (ZANTAC) 150 MG tablet Take 1 tablet (150 mg total) by mouth at bedtime. Patient not taking: Reported on 04/15/2019 03/20/17   Mauri Pole, MD    Family History Family History  Problem Relation Age of Onset  . Hypertension Mother   . Diabetes Father   . Hypertension Father   . Stroke Father   . Heart attack Father   . Aneurysm Paternal Aunt   . Schizophrenia Daughter   . Anxiety disorder Daughter   . ADD / ADHD Daughter   . Heart murmur Daughter   . Sleep disorder Daughter   . Colon cancer Neg Hx     Social History Social History   Tobacco Use  . Smoking status: Never Smoker  . Smokeless tobacco: Never Used  Substance Use Topics  . Alcohol use: No    Comment: occ  . Drug use: No     Allergies   Ciprofloxacin, Metronidazole, Shellfish-derived products, Toradol [ketorolac tromethamine], Eggs or egg-derived products, Ketorolac, Sulfur, Amoxicillin, and Influenza vaccines   Review of Systems Review of Systems  All other systems reviewed and are negative.    Physical Exam Updated Vital Signs BP (!) 159/100   Pulse 71   Temp 99.1 F (37.3 C) (Oral)   Resp 18   Wt 127.5 kg   LMP 04/12/2019   SpO2 100%   BMI 46.05 kg/m   Physical Exam Vitals signs and nursing note reviewed.  Constitutional:      General: She is not in acute distress.    Appearance: She is well-developed.  HENT:     Head: Normocephalic and atraumatic.     Right Ear: External ear normal.     Left Ear: External ear normal.     Mouth/Throat:     Mouth: Mucous membranes are moist.     Pharynx: No oropharyngeal exudate or posterior oropharyngeal erythema.  Eyes:     General: No scleral icterus.       Right eye: No discharge.        Left eye: No discharge.     Conjunctiva/sclera: Conjunctivae normal.  Neck:     Musculoskeletal: Normal range of motion and neck supple. No muscular tenderness.     Trachea: No tracheal deviation.     Comments: No palpable neck  mass Cardiovascular:     Rate and Rhythm: Normal rate and regular rhythm.  Pulmonary:     Effort: Pulmonary effort is normal. No respiratory distress.     Breath sounds: Normal breath sounds.  No stridor.  Abdominal:     General: There is no distension.  Musculoskeletal:        General: No swelling or deformity.  Skin:    General: Skin is warm and dry.     Findings: No rash.  Neurological:     Mental Status: She is alert.     Cranial Nerves: Cranial nerve deficit: no gross deficits.      ED Treatments / Results  Labs (all labs ordered are listed, but only abnormal results are displayed) Labs Reviewed - No data to display  EKG None  Radiology No results found.  Procedures Procedures (including critical care time)  Medications Ordered in ED Medications - No data to display   Initial Impression / Assessment and Plan / ED Course  I have reviewed the triage vital signs and the nursing notes.  Pertinent labs & imaging results that were available during my care of the patient were reviewed by me and considered in my medical decision making (see chart for details).  Clinical Course as of Apr 14 1814  Mon Apr 15, 2019  1726 Discussed with Dr Bryan Lemma.  Will try to get pt worked in sooner and expedite a probable endoscopy   [JK]    Clinical Course User Index [JK] Dorie Rank, MD     Pt presents with worsening dysphagia.  May have an esophageal stricture. Pt is able to handle her secretions.  No difficulty speaking.   No external mass noted on exam.  I spoke with Dr Bryan Lemma.  They will try to work her in sooner, someone should contact her tomorrow regarding an appointment.  Final Clinical Impressions(s) / ED Diagnoses   Final diagnoses:  Dysphagia, unspecified type    ED Discharge Orders    None       Dorie Rank, MD 04/15/19 [phone]

## 2019-04-15 NOTE — ED Notes (Signed)
An After Visit Summary was printed and given to the patient. Discharge instructions given and no further questions at this time.  

## 2019-04-16 ENCOUNTER — Telehealth: Payer: Self-pay | Admitting: Gastroenterology

## 2019-04-16 NOTE — Telephone Encounter (Signed)
Pt was in the ED for dysphagia--she reported that she is unable to swallow her medications.  Pt stated that she had spoken to on-call MD yesterday and was told that we will arrange an appointment for her today.

## 2019-04-16 NOTE — Telephone Encounter (Signed)
The pt was asking for an appt today for her dysphagia.  No appts are available today.  She was advised that she has an appt on 8/20 with Alonza Bogus previously scheduled.  She will keep that appt and will take small bites and chew her food carefully avoiding meats, etc.

## 2019-04-18 ENCOUNTER — Encounter: Payer: Self-pay | Admitting: Gastroenterology

## 2019-04-18 ENCOUNTER — Ambulatory Visit: Payer: Medicaid Other | Admitting: Gastroenterology

## 2019-04-18 VITALS — BP 120/64 | HR 100 | Temp 98.3°F | Ht 65.5 in | Wt 276.5 lb

## 2019-04-18 DIAGNOSIS — K5900 Constipation, unspecified: Secondary | ICD-10-CM | POA: Diagnosis not present

## 2019-04-18 DIAGNOSIS — R1310 Dysphagia, unspecified: Secondary | ICD-10-CM | POA: Diagnosis not present

## 2019-04-18 NOTE — Progress Notes (Signed)
Agree with the assessment and plan as outlined by Alonza Bogus, PA-C. Plan for expedited EGD with me early next week for diagnostic and potentially therapeutic intent. If unrevealing, plan for EM.   Krista Coutts, DO, [facility]

## 2019-04-18 NOTE — Progress Notes (Signed)
04/18/2019 [name] [id number] August 18, 1976   HISTORY OF PRESENT ILLNESS:  This is a 43 year old female who is a patient of Dr. Woodward Ku.  Had complaints of dysphagia in 2018 and had EGD that was normal.  Was placed on omeprazole 40 mg daily and it was recommended that she have an esophageal manometry.  She never had that performed because she said her swallowing seemed better.  Now she returns today with complaints of dysphasia for the past 3 weeks.  She tells me that this came on suddenly 3 weeks ago.  She describes solid food getting stuck and having to massage her neck to get food to go down.  She says at this point even mashed potatoes have a hard time going down and seem to get stuck unless they are very thin.  Having a hard time getting her pills down as well.  Even liquids want to come back up at times.  No longer on PPI.   Past Medical History:  Diagnosis Date  . Anal fissure   . Anxiety   . Asthma   . Back pain   . Bronchitis   . Chronic headaches   . Complication of anesthesia    Slow to arouse after anesthesia  . Diabetes mellitus without complication ([place])   . Fibroid   . Hypertension   . Lupus ([place])   . Neck pain   . Obesity   . Ovarian cyst    Past Surgical History:  Procedure Laterality Date  . APPENDECTOMY    . BREAST LUMPECTOMY     right breast  . DILATION AND CURETTAGE OF UTERUS    . OVARIAN CYST REMOVAL    . WISDOM TOOTH EXTRACTION      reports that she has never smoked. She has never used smokeless tobacco. She reports that she does not drink alcohol or use drugs. family history includes ADD / ADHD in her daughter; Aneurysm in her paternal aunt; Anxiety disorder in her daughter; Diabetes in her father; Heart attack in her father; Heart murmur in her daughter; Hypertension in her father and mother; Schizophrenia in her daughter; Sleep disorder in her daughter; Stroke in her father. Allergies  Allergen Reactions  . Ciprofloxacin Shortness Of  Breath, Diarrhea and Other (See Comments)    numbness  . Metronidazole Shortness Of Breath, Diarrhea and Other (See Comments)    numbness  . Shellfish-Derived Products Anaphylaxis  . Toradol [Ketorolac Tromethamine] Anaphylaxis and Hives  . Eggs Or Egg-Derived Products     vomiting  . Ketorolac   . Sulfur   . Amoxicillin Nausea And Vomiting  . Influenza Vaccines Nausea And Vomiting      Outpatient Encounter Medications as of 04/18/2019  Medication Sig  . albuterol (PROVENTIL HFA;VENTOLIN HFA) 108 (90 BASE) MCG/ACT inhaler Inhale 1-2 puffs into the lungs every 6 (six) hours as needed for wheezing or shortness of breath.  Marland Kitchen amLODipine (NORVASC) 5 MG tablet Take 1 tablet by mouth daily. Has not started yet  . cyclobenzaprine (FLEXERIL) 10 MG tablet Take 1 tablet (10 mg total) by mouth 3 (three) times daily as needed for muscle spasms.  . ferrous sulfate ([employer] IRON) 325 (65 FE) MG tablet Take 325 mg by mouth daily.  . hydrochlorothiazide (HYDRODIURIL) 25 MG tablet Take 25 mg by mouth daily.  . hydroxychloroquine (PLAQUENIL) 200 MG tablet Take 1 tablet by mouth 2 (two) times daily.  Marland Kitchen lisinopril (ZESTRIL) 10 MG tablet Take 10 mg by mouth daily.  Marland Kitchen  metFORMIN (GLUCOPHAGE) 500 MG tablet Take 500 mg by mouth 3 (three) times daily.  . naproxen (NAPROSYN) 500 MG tablet Take 1 tablet by mouth daily as needed for mild pain.   . potassium chloride (K-DUR) 10 MEQ tablet Take 20 mEq by mouth daily.  . [DISCONTINUED] linaclotide (LINZESS) 145 MCG CAPS capsule Take 1 capsule (145 mcg total) by mouth daily before breakfast. (Patient taking differently: Take 145 mcg by mouth daily as needed (constipation). )  . [DISCONTINUED] omeprazole (PRILOSEC) 2 mg/mL SUSP Take 30 minutes before breakfast (Patient not taking: Reported on 04/15/2019)  . [DISCONTINUED] ranitidine (ZANTAC) 150 MG tablet Take 1 tablet (150 mg total) by mouth at bedtime. (Patient not taking: Reported on 04/15/2019)  . [DISCONTINUED] 0.9 %   sodium chloride infusion    No facility-administered encounter medications on file as of 04/18/2019.      REVIEW OF SYSTEMS  : All other systems reviewed and negative except where noted in the History of Present Illness.   PHYSICAL EXAM: BP 120/64   Pulse 100   Temp 98.3 F (36.8 C)   Ht 5' 5.5" (1.664 m)   Wt 276 lb 8 oz (125.4 kg)   LMP 04/12/2019   BMI 45.31 kg/m  General: Well developed black female in no acute distress Head: Normocephalic and atraumatic Eyes:  Sclerae anicteric, conjunctiva pink. Ears: Normal auditory acuity Lungs: Clear throughout to auscultation; no increased WOB. Heart: Regular rate and rhythm; no M/R/G. Abdomen: Soft, non-distended.  BS present.  Non-tender. Musculoskeletal: Symmetrical with no gross deformities  Skin: No lesions on visible extremities Extremities: No edema  Neurological: Alert oriented x 4, grossly non-focal Psychological:  Alert and cooperative. Normal mood and affect  ASSESSMENT AND PLAN: *Dysphagia:  Will be scheduled for EGD with possible dilation with Dr. Bryan Lemma (had appt sooner then Dr. Silverio Decamp and he offered to help).  I suspect that it will be normal as it was 2 years ago.  If so then will likely need esophageal manometry, which was recommended after last EGD and never performed.  Full liquid to very soft diet in the interim until procedure early next week. *Constipation:  Agree with Miralax daily as was recommended by her PCP.  **The risks, benefits, and alternatives to EGD with dilation were discussed with the patient and he consents to proceed.   CC:  Berkley Harvey, NP

## 2019-04-18 NOTE — Patient Instructions (Signed)
You have been scheduled for an endoscopy. Please follow written instructions given to you at your visit today. If you use inhalers (even only as needed), please bring them with you on the day of your procedure.  Use Miralax daily

## 2019-04-19 ENCOUNTER — Telehealth: Payer: Self-pay | Admitting: Gastroenterology

## 2019-04-19 NOTE — Telephone Encounter (Signed)

## 2019-04-22 ENCOUNTER — Ambulatory Visit (AMBULATORY_SURGERY_CENTER): Payer: Medicaid Other | Admitting: Gastroenterology

## 2019-04-22 ENCOUNTER — Encounter: Payer: Self-pay | Admitting: Gastroenterology

## 2019-04-22 ENCOUNTER — Other Ambulatory Visit: Payer: Self-pay

## 2019-04-22 VITALS — BP 119/70 | HR 85 | Temp 96.8°F | Resp 16 | Ht 65.0 in | Wt 276.0 lb

## 2019-04-22 DIAGNOSIS — R1310 Dysphagia, unspecified: Secondary | ICD-10-CM

## 2019-04-22 MED ORDER — SODIUM CHLORIDE 0.9 % IV SOLN: 500.0000 mL | Freq: Once | INTRAVENOUS | Status: DC

## 2019-04-22 NOTE — Patient Instructions (Signed)
YOU HAD AN ENDOSCOPIC PROCEDURE TODAY AT THE [facility]:   Refer to the procedure report that was given to you for any specific questions about what was found during the examination.  If the procedure report does not answer your questions, please call your gastroenterologist to clarify.  If you requested that your care partner not be given the details of your procedure findings, then the procedure report has been included in a sealed envelope for you to review at your convenience later.  YOU SHOULD EXPECT: Some feelings of bloating in the abdomen. Passage of more gas than usual.  Walking can help get rid of the air that was put into your GI tract during the procedure and reduce the bloating. If you had a lower endoscopy (such as a colonoscopy or flexible sigmoidoscopy) you may notice spotting of blood in your stool or on the toilet paper. If you underwent a bowel prep for your procedure, you may not have a normal bowel movement for a few days.  Please Note:  You might notice some irritation and congestion in your nose or some drainage.  This is from the oxygen used during your procedure.  There is no need for concern and it should clear up in a day or so.  SYMPTOMS TO REPORT IMMEDIATELY:   Following upper endoscopy (EGD)  Vomiting of blood or coffee ground material  New chest pain or pain under the shoulder blades  Painful or persistently difficult swallowing  New shortness of breath  Fever of 100F or higher  Black, tarry-looking stools  For urgent or emergent issues, a gastroenterologist can be reached at any hour by calling [phone].   DIET:  We do recommend a small meal at first, but then you may proceed to your regular diet.  Drink plenty of fluids but you should avoid alcoholic beverages for 24 hours.  ACTIVITY:  You should plan to take it easy for the rest of today and you should NOT DRIVE or use heavy machinery until tomorrow (because of the sedation medicines used  during the test).    FOLLOW UP: Our staff will call the number listed on your records 48-72 hours following your procedure to check on you and address any questions or concerns that you may have regarding the information given to you following your procedure. If we do not reach you, we will leave a message.  We will attempt to reach you two times.  During this call, we will ask if you have developed any symptoms of COVID 19. If you develop any symptoms (ie: fever, flu-like symptoms, shortness of breath, cough etc.) before then, please call [phone].  If you test positive for Covid 19 in the 2 weeks post procedure, please call and report this information to us.    If any biopsies were taken you will be contacted by phone or by letter within the next 1-3 weeks.  Please call us at [phone] if you have not heard about the biopsies in 3 weeks.    SIGNATURES/CONFIDENTIALITY: You and/or your care partner have signed paperwork which will be entered into your electronic medical record.  These signatures attest to the fact that that the information above on your After Visit Summary has been reviewed and is understood.  Full responsibility of the confidentiality of this discharge information lies with you and/or your care-partner. 

## 2019-04-22 NOTE — Progress Notes (Signed)
Called to room to assist during endoscopic procedure.  Patient ID and intended procedure confirmed with present staff. Received instructions for my participation in the procedure from the performing physician.  

## 2019-04-22 NOTE — Op Note (Signed)
[name] Procedure Date: 04/22/2019 8:33 AM MRN: [id number] Endoscopist: Gerrit Heck , MD Age: 43 Referring MD:  Date of Birth: 03-Feb-1976 Gender: Female Account #: [id number] Procedure:                Upper GI endoscopy Indications:              Dysphagia                           43 yo female with recent onset solid food dysphagia                            for the last 4 weeks, pointing to the anterior neck. Medicines:                Monitored Anesthesia Care Procedure:                Pre-Anesthesia Assessment:                           - Prior to the procedure, a History and Physical                            was performed, and patient medications and                            allergies were reviewed. The patient's tolerance of                            previous anesthesia was also reviewed. The risks                            and benefits of the procedure and the sedation                            options and risks were discussed with the patient.                            All questions were answered, and informed consent                            was obtained. Prior Anticoagulants: The patient has                            taken no previous anticoagulant or antiplatelet                            agents. ASA Grade Assessment: III - A patient with                            severe systemic disease. After reviewing the risks                            and benefits, the patient was deemed in  satisfactory condition to undergo the procedure.                           After obtaining informed consent, the endoscope was                            passed under direct vision. Throughout the                            procedure, the patient's blood pressure, pulse, and                            oxygen saturations were monitored continuously. The                            Endoscope was introduced through  the mouth, and                            advanced to the second part of duodenum. The upper                            GI endoscopy was accomplished without difficulty.                            The patient tolerated the procedure well. Scope In: Scope Out: Findings:                 The examined esophagus was normal. The scope was                            withdrawn. Dilation was performed with a Maloney                            dilator with no resistance at 65 Fr. The dilation                            site was examined following endoscope reinsertion                            and showed no bleeding, mucosal tear or                            perforation. Estimated blood loss: none.                           The gastroesophageal flap valve was visualized                            endoscopically and classified as Hill Grade II                            (fold present, opens with respiration).                           The Z-line  was regular and was found 37 cm from the                            incisors.                           The entire examined stomach was normal.                           The duodenal bulb, first portion of the duodenum                            and second portion of the duodenum were normal.                           Localized edema noted at the base of the vocal                            cords. Complications:            No immediate complications. Estimated Blood Loss:     Estimated blood loss: none. Impression:               - Normal esophagus. Dilated.                           - Gastroesophageal flap valve classified as Hill                            Grade II (fold present, opens with respiration).                           - Z-line regular, 37 cm from the incisors.                           - Normal stomach.                           - Normal duodenal bulb, first portion of the                            duodenum and second portion of the  duodenum.                           - Vocal cords.                           - No specimens collected. Recommendation:           - Patient has a contact number available for                            emergencies. The signs and symptoms of potential                            delayed complications were discussed with the  patient. Return to normal activities tomorrow.                            Written discharge instructions were provided to the                            patient.                           - Resume previous diet.                           - Continue present medications.                           - Return to GI clinic PRN.                           - Place referral to ENT for further evauation of                            vocal cords.                           - If symptoms persist, recommend further evaluation                            with esophageal manometry. Gerrit Heck, MD 04/22/2019 9:08:07 AM

## 2019-04-22 NOTE — Progress Notes (Signed)
JB temp  CW vitals   Pt states eggs cause vomiting and Throat to swell No soy allergy  I have reviewed the patient's medical history in detail and updated the computerized patient record.

## 2019-04-22 NOTE — Progress Notes (Signed)
PT taken to PACU. Monitors in place. VSS. Report given to RN. 

## 2019-04-24 ENCOUNTER — Telehealth: Payer: Self-pay

## 2019-04-24 NOTE — Telephone Encounter (Signed)
  Follow up Call-  Call back number 04/22/2019 03/08/2017  Post procedure Call Back phone  # [phone]  Permission to leave phone message Yes Yes  Some recent data might be hidden     Patient questions:  Do you have a fever, pain , or abdominal swelling? No. Pain Score  0 *  Have you tolerated food without any problems? Yes.    Have you been able to return to your normal activities? Yes.    Do you have any questions about your discharge instructions: Diet   No. Medications  No. Follow up visit  No.  Do you have questions or concerns about your Care? No.  Actions: * If pain score is 4 or above: No action needed, pain <4. 1. Have you developed a fever since your procedure? no  2.   Have you had an respiratory symptoms (SOB or cough) since your procedure? no  3.   Have you tested positive for COVID 19 since your procedure no  4.   Have you had any family members/close contacts diagnosed with the COVID 19 since your procedure?  no   If yes to any of these questions please route to Joylene John, RN and Alphonsa Gin, Therapist, sports.

## 2019-04-25 ENCOUNTER — Other Ambulatory Visit: Payer: Self-pay

## 2019-04-25 DIAGNOSIS — R1310 Dysphagia, unspecified: Secondary | ICD-10-CM

## 2019-04-25 DIAGNOSIS — R1319 Other dysphagia: Secondary | ICD-10-CM

## 2019-04-25 NOTE — Progress Notes (Signed)
ENT referral requested post EGD on 04/22/2019 for evaluation of vocal cords;

## 2019-05-10 ENCOUNTER — Other Ambulatory Visit (HOSPITAL_COMMUNITY): Payer: Self-pay | Admitting: *Deleted

## 2019-05-10 DIAGNOSIS — R1310 Dysphagia, unspecified: Secondary | ICD-10-CM

## 2019-05-17 ENCOUNTER — Ambulatory Visit (HOSPITAL_COMMUNITY): Payer: Medicaid Other

## 2019-05-17 ENCOUNTER — Encounter (HOSPITAL_COMMUNITY): Payer: Medicaid Other

## 2019-05-17 ENCOUNTER — Encounter (HOSPITAL_COMMUNITY): Payer: Self-pay

## 2019-06-26 ENCOUNTER — Ambulatory Visit (HOSPITAL_COMMUNITY)
Admission: RE | Admit: 2019-06-26 | Discharge: 2019-06-26 | Disposition: A | Discharge status: Home / Self Care | Payer: Medicaid Other | Source: Ambulatory Visit | Attending: Otolaryngology | Admitting: Otolaryngology

## 2019-06-26 ENCOUNTER — Other Ambulatory Visit: Payer: Self-pay

## 2019-06-26 DIAGNOSIS — R1310 Dysphagia, unspecified: Secondary | ICD-10-CM | POA: Diagnosis not present

## 2019-08-27 DIAGNOSIS — K219 Gastro-esophageal reflux disease without esophagitis: Secondary | ICD-10-CM | POA: Insufficient documentation

## 2019-11-14 ENCOUNTER — Other Ambulatory Visit: Payer: Self-pay | Admitting: Nurse Practitioner

## 2019-11-14 DIAGNOSIS — M79606 Pain in leg, unspecified: Secondary | ICD-10-CM

## 2019-11-14 DIAGNOSIS — M7989 Other specified soft tissue disorders: Secondary | ICD-10-CM

## 2019-11-18 ENCOUNTER — Ambulatory Visit
Admission: RE | Admit: 2019-11-18 | Discharge: 2019-11-18 | Disposition: A | Discharge status: Home / Self Care | Payer: 59 | Source: Ambulatory Visit | Attending: Nurse Practitioner | Admitting: Nurse Practitioner

## 2019-11-18 DIAGNOSIS — M79606 Pain in leg, unspecified: Secondary | ICD-10-CM

## 2019-11-18 DIAGNOSIS — M7989 Other specified soft tissue disorders: Secondary | ICD-10-CM

## 2020-04-01 DIAGNOSIS — Z0289 Encounter for other administrative examinations: Secondary | ICD-10-CM

## 2020-04-29 ENCOUNTER — Encounter (HOSPITAL_COMMUNITY): Payer: Self-pay | Admitting: Emergency Medicine

## 2020-04-29 ENCOUNTER — Emergency Department (HOSPITAL_COMMUNITY): Payer: Medicaid Other

## 2020-04-29 ENCOUNTER — Other Ambulatory Visit: Payer: Self-pay

## 2020-04-29 ENCOUNTER — Emergency Department (HOSPITAL_COMMUNITY)
Admission: EM | Admit: 2020-04-29 | Discharge: 2020-04-29 | Disposition: A | Discharge status: Home / Self Care | Payer: Medicaid Other | Attending: Emergency Medicine | Admitting: Emergency Medicine

## 2020-04-29 DIAGNOSIS — I10 Essential (primary) hypertension: Secondary | ICD-10-CM | POA: Insufficient documentation

## 2020-04-29 DIAGNOSIS — J45909 Unspecified asthma, uncomplicated: Secondary | ICD-10-CM | POA: Insufficient documentation

## 2020-04-29 DIAGNOSIS — E119 Type 2 diabetes mellitus without complications: Secondary | ICD-10-CM | POA: Insufficient documentation

## 2020-04-29 DIAGNOSIS — R072 Precordial pain: Secondary | ICD-10-CM | POA: Diagnosis present

## 2020-04-29 DIAGNOSIS — U071 COVID-19: Secondary | ICD-10-CM

## 2020-04-29 DIAGNOSIS — Z79899 Other long term (current) drug therapy: Secondary | ICD-10-CM | POA: Insufficient documentation

## 2020-04-29 DIAGNOSIS — R079 Chest pain, unspecified: Secondary | ICD-10-CM

## 2020-04-29 LAB — CBC
HCT: 35.8 % — ABNORMAL LOW (ref 36.0–46.0)
Hemoglobin: 11.3 g/dL — ABNORMAL LOW (ref 12.0–15.0)
MCH: 26 pg (ref 26.0–34.0)
MCHC: 31.6 g/dL (ref 30.0–36.0)
MCV: 82.5 fL (ref 80.0–100.0)
Platelets: 381 10*3/uL (ref 150–400)
RBC: 4.34 MIL/uL (ref 3.87–5.11)
RDW: 16.1 % — ABNORMAL HIGH (ref 11.5–15.5)
WBC: 7.2 10*3/uL (ref 4.0–10.5)
nRBC: 0 % (ref 0.0–0.2)

## 2020-04-29 LAB — BASIC METABOLIC PANEL
Anion gap: 12 (ref 5–15)
BUN: 9 mg/dL (ref 6–20)
CO2: 23 mmol/L (ref 22–32)
Calcium: 9 mg/dL (ref 8.9–10.3)
Chloride: 98 mmol/L (ref 98–111)
Creatinine, Ser: 1.01 mg/dL — ABNORMAL HIGH (ref 0.44–1.00)
GFR calc Af Amer: >60 mL/min (ref >60)
GFR calc non Af Amer: >60 mL/min (ref >60)
Glucose, Bld: 100 mg/dL — ABNORMAL HIGH (ref 70–99)
Potassium: 3.3 mmol/L — ABNORMAL LOW (ref 3.5–5.1)
Sodium: 133 mmol/L — ABNORMAL LOW (ref 135–145)

## 2020-04-29 LAB — I-STAT BETA HCG BLOOD, ED (MC, WL, AP ONLY): I-stat hCG, quantitative: <5 m[IU]/mL (ref <5)

## 2020-04-29 LAB — TROPONIN I (HIGH SENSITIVITY): Troponin I (High Sensitivity): 2 ng/L (ref <18)

## 2020-04-29 NOTE — ED Provider Notes (Signed)
[facility] DEPT Provider Note   CSN: [id number] Arrival date & time: 04/29/20  6203     History Chief Complaint  Patient presents with  . covid positive  . Chest Pain  . Hypertension    [name] is a 44 y.o. female.  The history is provided by the patient.  Chest Pain Pain location:  Substernal area Pain quality: aching   Pain radiates to:  Does not radiate Pain severity:  No pain Onset quality:  Gradual Timing:  Intermittent Progression:  Waxing and waning Chronicity:  New Context comment:  When coughing Relieved by:  Nothing Worsened by:  Nothing Associated symptoms: cough   Associated symptoms: no abdominal pain, no back pain, no fever, no palpitations, no shortness of breath and no vomiting   Risk factors: hypertension   Risk factors comment:  Asthma, lupus      Past Medical History:  Diagnosis Date  . Anal fissure   . Anxiety   . Asthma   . Back pain   . Bronchitis   . Chronic headaches   . Complication of anesthesia    Slow to arouse after anesthesia  . Diabetes mellitus without complication ([place])   . Fibroid   . Hypertension   . Lupus ([place])   . Neck pain   . Obesity   . Ovarian cyst     Patient Active Problem List   Diagnosis Date Noted  . Constipation 04/18/2019  . Low back pain 09/17/2015  . Abnormal laboratory test 08/03/2015  . Fatigue 08/03/2015  . Excessive sleepiness 07/30/2015  . Migraine 07/30/2015  . Memory loss 07/30/2015  . Dysphagia 06/29/2015    Past Surgical History:  Procedure Laterality Date  . APPENDECTOMY    . BREAST LUMPECTOMY     right breast  . DILATION AND CURETTAGE OF UTERUS    . OVARIAN CYST REMOVAL    . WISDOM TOOTH EXTRACTION       OB History    Gravida  3   Para  1   Term  1   Preterm      AB  2   Living  1     SAB  2   TAB      Ectopic      Multiple      Live Births  1           Family History  Problem Relation Age of Onset  .  Hypertension Mother   . Diabetes Father   . Hypertension Father   . Stroke Father   . Heart attack Father   . Aneurysm Paternal Aunt   . Schizophrenia Daughter   . Anxiety disorder Daughter   . ADD / ADHD Daughter   . Heart murmur Daughter   . Sleep disorder Daughter   . Colon cancer Neg Hx     Social History   Tobacco Use  . Smoking status: Never Smoker  . Smokeless tobacco: Never Used  Vaping Use  . Vaping Use: Never used  Substance Use Topics  . Alcohol use: No    Comment: occ  . Drug use: No    Home Medications Prior to Admission medications   Medication Sig Start Date End Date Taking? Authorizing Provider  albuterol (PROVENTIL HFA;VENTOLIN HFA) 108 (90 BASE) MCG/ACT inhaler Inhale 1-2 puffs into the lungs every 6 (six) hours as needed for wheezing or shortness of breath. 05/05/14   Debby Freiberg, MD  amLODipine (NORVASC) 5 MG tablet Take 1  tablet by mouth daily. Has not started yet 12/29/16   [provider]  cyclobenzaprine (FLEXERIL) 10 MG tablet Take 1 tablet (10 mg total) by mouth 3 (three) times daily as needed for muscle spasms. 03/15/17   Marcial Pacas, MD  ferrous sulfate ([facility] IRON) 325 (65 FE) MG tablet Take 325 mg by mouth daily.    [provider]  hydrochlorothiazide (HYDRODIURIL) 25 MG tablet Take 25 mg by mouth daily.    [provider]  hydroxychloroquine (PLAQUENIL) 200 MG tablet Take 1 tablet by mouth 2 (two) times daily. 11/17/15   [provider]  lisinopril (ZESTRIL) 10 MG tablet Take 10 mg by mouth daily. 02/15/19   [provider]  metFORMIN (GLUCOPHAGE) 500 MG tablet Take 500 mg by mouth 3 (three) times daily. 05/31/15   [provider]  naproxen (NAPROSYN) 500 MG tablet Take 1 tablet by mouth daily as needed for mild pain.  12/16/15   [provider]  potassium chloride (K-DUR) 10 MEQ tablet Take 20 mEq by mouth daily.    [provider]    Allergies    Ciprofloxacin, Metronidazole,  Shellfish-derived products, Toradol [ketorolac tromethamine], Eggs or egg-derived products, Ketorolac, Sulfur, Amoxicillin, and Influenza vaccines  Review of Systems   Review of Systems  Constitutional: Negative for chills and fever.  HENT: Negative for ear pain and sore throat.   Eyes: Negative for pain and visual disturbance.  Respiratory: Positive for cough. Negative for shortness of breath.   Cardiovascular: Positive for chest pain. Negative for palpitations.  Gastrointestinal: Negative for abdominal pain and vomiting.  Genitourinary: Negative for dysuria and hematuria.  Musculoskeletal: Negative for arthralgias and back pain.  Skin: Negative for color change and rash.  Neurological: Negative for seizures and syncope.  All other systems reviewed and are negative.   Physical Exam Updated Vital Signs  ED Triage Vitals  Enc Vitals Group     BP 04/29/20 0844 (!) 159/95     Pulse Rate 04/29/20 0844 74     Resp 04/29/20 0844 16     Temp 04/29/20 0844 99.1 F (37.3 C)     Temp Source 04/29/20 0844 Oral     SpO2 04/29/20 0844 100 %     Weight --      Height --      Head Circumference --      Peak Flow --      Pain Score 04/29/20 0835 7     Pain Loc --      Pain Edu? --      Excl. in [place]? --     Physical Exam Vitals and nursing note reviewed.  Constitutional:      General: She is not in acute distress.    Appearance: She is well-developed. She is not ill-appearing.  HENT:     Head: Normocephalic and atraumatic.  Eyes:     Conjunctiva/sclera: Conjunctivae normal.  Cardiovascular:     Rate and Rhythm: Normal rate and regular rhythm.     Pulses:          Radial pulses are 2+ on the right side and 2+ on the left side.     Heart sounds: Normal heart sounds. No murmur heard.   Pulmonary:     Effort: Pulmonary effort is normal. No respiratory distress.     Breath sounds: Normal breath sounds. No decreased breath sounds.  Abdominal:     Palpations: Abdomen is soft.      Tenderness: There is no  abdominal tenderness.  Musculoskeletal:        General: Normal range of motion.     Cervical back: Neck supple.     Right lower leg: No edema.     Left lower leg: No edema.  Skin:    General: Skin is warm and dry.  Neurological:     Mental Status: She is alert.     ED Results / Procedures / Treatments   Labs (all labs ordered are listed, but only abnormal results are displayed) Labs Reviewed  CBC - Abnormal; Notable for the following components:      Result Value   Hemoglobin 11.3 (*)    HCT 35.8 (*)    RDW 16.1 (*)    All other components within normal limits  BASIC METABOLIC PANEL  I-STAT BETA HCG BLOOD, ED ([facility], [facility], [facility] ONLY)  TROPONIN I (HIGH SENSITIVITY)  TROPONIN I (HIGH SENSITIVITY)    EKG EKG Interpretation  Date/Time:  Wednesday April 29 2020 08:40:45 EDT Ventricular Rate:  73 PR Interval:    QRS Duration: 97 QT Interval:  418 QTC Calculation: 461 R Axis:   38 Text Interpretation: Sinus rhythm 12 Lead; [place] No significant change since last tracing Confirmed by Isla Pence [phone]) on 04/29/2020 9:49:27 AM   Radiology DG Chest Port 1 View  Result Date: 04/29/2020 CLINICAL DATA:  Chest pain, weakness, shortness of breath, COVID positive. EXAM: PORTABLE CHEST 1 VIEW COMPARISON:  CT chest 09/02/2015, chest radiograph 05/05/2014 FINDINGS: Borderline enlargement of the cardiac silhouette, similar to prior. Both lungs are clear. No effusions. No pneumothorax. The visualized skeletal structures are unremarkable. IMPRESSION: 1. No acute cardiopulmonary disease. 2. Similar borderline cardiomegaly. Electronically Signed   By: Margaretha Sheffield MD   On: 04/29/2020 09:10    Procedures Procedures (including critical care time)  Medications Ordered in ED Medications - No data to display  ED Course  I have reviewed the triage vital signs and the nursing notes.  Pertinent labs & imaging results that were available during my care of the  patient were reviewed by me and considered in my medical decision making (see chart for details).    MDM Rules/Calculators/A&P                          [name] is a 44 year old female with history of lupus, asthma who presents to the ED with hypertension.  Blood pressure is overall unremarkable.  159/95.  Currently diagnosed with Covid.  Right now she has no symptoms.  She is not having any shortness of breath or chest pain.  She has had some intermittent chest discomfort at night.  Overall she is very well-appearing.  Chest x-ray shows no signs of pneumonia, no pneumothorax.  Lab work showed no significant anemia, electrolyte abnormality, kidney injury.  Troponin normal.  EKG showed sinus rhythm.  No ischemic changes.  Overall believe that she is just experiencing Covid type pain.  She is not hypoxic she is not tachycardic.  No concern for blood clot.  No concern for ACS.  Recommend continued home medication and given return precautions.  Discharged in good condition.   This chart was dictated using voice recognition software.  Despite best efforts to proofread,  errors can occur which can change the documentation meaning.  [name] was evaluated in Emergency Department on 04/29/2020 for the symptoms described in the history of present illness. She was evaluated in the context of the global COVID-19 pandemic, which necessitated  consideration that the patient might be at risk for infection with the SARS-CoV-2 virus that causes COVID-19. Institutional protocols and algorithms that pertain to the evaluation of patients at risk for COVID-19 are in a state of rapid change based on information released by regulatory bodies including the CDC and federal and state organizations. These policies and algorithms were followed during the patient's care in the ED.     Final Clinical Impression(s) / ED Diagnoses Final diagnoses:  [id number]    Rx / DC Orders ED Discharge Orders    None         Lennice Sites, DO 04/29/20 1258

## 2020-04-29 NOTE — ED Triage Notes (Signed)
Pt reports Covid+ 8/27. Reports past 4 days has central chest pains, cough, SOb and hypertension intermittently and worse when laying down. Checking her BP 150/99 with her HTN meds, so PCP instructed pt to go to ED due to chest pains with high BP.

## 2020-04-29 NOTE — Discharge Instructions (Addendum)
Lab work today was unremarkable.  Continue home isolation.  Please return if symptoms worsen.

## 2020-04-29 NOTE — ED Notes (Signed)
hasnt had HTN meds today

## 2020-07-19 ENCOUNTER — Emergency Department (HOSPITAL_COMMUNITY)
Admission: EM | Admit: 2020-07-19 | Discharge: 2020-07-19 | Disposition: A | Discharge status: Home / Self Care | Payer: Medicaid Other | Attending: Emergency Medicine | Admitting: Emergency Medicine

## 2020-07-19 ENCOUNTER — Emergency Department (HOSPITAL_COMMUNITY): Payer: Medicaid Other

## 2020-07-19 ENCOUNTER — Ambulatory Visit (HOSPITAL_COMMUNITY): Payer: Self-pay

## 2020-07-19 ENCOUNTER — Other Ambulatory Visit: Payer: Self-pay

## 2020-07-19 DIAGNOSIS — J069 Acute upper respiratory infection, unspecified: Secondary | ICD-10-CM | POA: Insufficient documentation

## 2020-07-19 DIAGNOSIS — I10 Essential (primary) hypertension: Secondary | ICD-10-CM | POA: Insufficient documentation

## 2020-07-19 DIAGNOSIS — J029 Acute pharyngitis, unspecified: Secondary | ICD-10-CM | POA: Insufficient documentation

## 2020-07-19 DIAGNOSIS — R0981 Nasal congestion: Secondary | ICD-10-CM | POA: Diagnosis present

## 2020-07-19 DIAGNOSIS — Z20822 Contact with and (suspected) exposure to covid-19: Secondary | ICD-10-CM | POA: Insufficient documentation

## 2020-07-19 DIAGNOSIS — Z7984 Long term (current) use of oral hypoglycemic drugs: Secondary | ICD-10-CM | POA: Diagnosis not present

## 2020-07-19 DIAGNOSIS — J45909 Unspecified asthma, uncomplicated: Secondary | ICD-10-CM | POA: Diagnosis not present

## 2020-07-19 DIAGNOSIS — Z79899 Other long term (current) drug therapy: Secondary | ICD-10-CM | POA: Diagnosis not present

## 2020-07-19 DIAGNOSIS — E119 Type 2 diabetes mellitus without complications: Secondary | ICD-10-CM | POA: Insufficient documentation

## 2020-07-19 DIAGNOSIS — J3489 Other specified disorders of nose and nasal sinuses: Secondary | ICD-10-CM | POA: Insufficient documentation

## 2020-07-19 LAB — RESPIRATORY PANEL BY RT PCR (FLU A&B, COVID)
Influenza A by PCR: NEGATIVE
Influenza B by PCR: NEGATIVE
SARS Coronavirus 2 by RT PCR: NEGATIVE

## 2020-07-19 MED ORDER — PREDNISONE 20 MG PO TABS
60.0000 mg | ORAL_TABLET | Freq: Once | ORAL | Status: AC
  Administered 2020-07-19: 60 mg via ORAL
  Filled 2020-07-19: qty 3

## 2020-07-19 MED ORDER — PREDNISONE 20 MG PO TABS: ORAL_TABLET | ORAL | 0 refills | Status: DC

## 2020-07-19 MED ORDER — AZITHROMYCIN 250 MG PO TABS: 250.0000 mg | ORAL_TABLET | Freq: Every day | ORAL | 0 refills | Status: DC

## 2020-07-19 MED ORDER — ALBUTEROL SULFATE HFA 108 (90 BASE) MCG/ACT IN AERS
8.0000 | INHALATION_SPRAY | Freq: Once | RESPIRATORY_TRACT | Status: AC
  Administered 2020-07-19: 8 via RESPIRATORY_TRACT
  Filled 2020-07-19: qty 6.7

## 2020-07-19 MED ORDER — AZITHROMYCIN 250 MG PO TABS
500.0000 mg | ORAL_TABLET | Freq: Once | ORAL | Status: AC
  Administered 2020-07-19: 500 mg via ORAL
  Filled 2020-07-19: qty 2

## 2020-07-19 NOTE — ED Notes (Signed)
Pt states she gets chest pain when she coughs.

## 2020-07-19 NOTE — ED Provider Notes (Signed)
[facility] EMERGENCY DEPARTMENT Provider Note   CSN: [id number] Arrival date & time: 07/19/20  0533     History Chief Complaint  Patient presents with  . Nasal Congestion    [name] is a 44 y.o. female.   URI Presenting symptoms: congestion, cough, rhinorrhea and sore throat   Severity:  Mild Onset quality:  Gradual Timing:  Constant Progression:  Worsening Chronicity:  New Relieved by:  None tried Worsened by:  Nothing Ineffective treatments:  None tried      Past Medical History:  Diagnosis Date  . Anal fissure   . Anxiety   . Asthma   . Back pain   . Bronchitis   . Chronic headaches   . Complication of anesthesia    Slow to arouse after anesthesia  . Diabetes mellitus without complication ([place])   . Fibroid   . Hypertension   . Lupus ([place])   . Neck pain   . Obesity   . Ovarian cyst     Patient Active Problem List   Diagnosis Date Noted  . Constipation 04/18/2019  . Low back pain 09/17/2015  . Abnormal laboratory test 08/03/2015  . Fatigue 08/03/2015  . Excessive sleepiness 07/30/2015  . Migraine 07/30/2015  . Memory loss 07/30/2015  . Dysphagia 06/29/2015    Past Surgical History:  Procedure Laterality Date  . APPENDECTOMY    . BREAST LUMPECTOMY     right breast  . DILATION AND CURETTAGE OF UTERUS    . OVARIAN CYST REMOVAL    . WISDOM TOOTH EXTRACTION       OB History    Gravida  3   Para  1   Term  1   Preterm      AB  2   Living  1     SAB  2   TAB      Ectopic      Multiple      Live Births  1           Family History  Problem Relation Age of Onset  . Hypertension Mother   . Diabetes Father   . Hypertension Father   . Stroke Father   . Heart attack Father   . Aneurysm Paternal Aunt   . Schizophrenia Daughter   . Anxiety disorder Daughter   . ADD / ADHD Daughter   . Heart murmur Daughter   . Sleep disorder Daughter   . Colon cancer Neg Hx     Social History   Tobacco  Use  . Smoking status: Never Smoker  . Smokeless tobacco: Never Used  Vaping Use  . Vaping Use: Never used  Substance Use Topics  . Alcohol use: No    Comment: occ  . Drug use: No    Home Medications Prior to Admission medications   Medication Sig Start Date End Date Taking? Authorizing Provider  albuterol (PROVENTIL HFA;VENTOLIN HFA) 108 (90 BASE) MCG/ACT inhaler Inhale 1-2 puffs into the lungs every 6 (six) hours as needed for wheezing or shortness of breath. 05/05/14   Debby Freiberg, MD  amLODipine (NORVASC) 5 MG tablet Take 1 tablet by mouth daily. Has not started yet 12/29/16   [provider]  azithromycin (ZITHROMAX) 250 MG tablet Take 1 tablet (250 mg total) by mouth daily. Take first 2 tablets together, then 1 every day until finished. 07/19/20   Kristeena Meineke, Corene Cornea, MD  cyclobenzaprine (FLEXERIL) 10 MG tablet Take 1 tablet (10 mg total) by mouth 3 (  three) times daily as needed for muscle spasms. 03/15/17   Marcial Pacas, MD  ferrous sulfate ([facility] IRON) 325 (65 FE) MG tablet Take 325 mg by mouth daily.    [provider]  hydrochlorothiazide (HYDRODIURIL) 25 MG tablet Take 25 mg by mouth daily.    [provider]  hydroxychloroquine (PLAQUENIL) 200 MG tablet Take 1 tablet by mouth 2 (two) times daily. 11/17/15   [provider]  lisinopril (ZESTRIL) 10 MG tablet Take 10 mg by mouth daily. 02/15/19   [provider]  metFORMIN (GLUCOPHAGE) 500 MG tablet Take 500 mg by mouth 3 (three) times daily. 05/31/15   [provider]  naproxen (NAPROSYN) 500 MG tablet Take 1 tablet by mouth daily as needed for mild pain.  12/16/15   [provider]  potassium chloride (K-DUR) 10 MEQ tablet Take 20 mEq by mouth daily.    [provider]  predniSONE (DELTASONE) 20 MG tablet 3 tabs po day one, then 2 po daily x 4 days 07/19/20   Sai Moura, Corene Cornea, MD    Allergies    Ciprofloxacin, Metronidazole, Shellfish-derived products, Toradol [ketorolac  tromethamine], Eggs or egg-derived products, Ketorolac, Sulfur, Amoxicillin, and Influenza vaccines  Review of Systems   Review of Systems  HENT: Positive for congestion, rhinorrhea and sore throat.   Respiratory: Positive for cough.   All other systems reviewed and are negative.   Physical Exam Updated Vital Signs BP (!) 141/80   Pulse 89   Temp 98.2 F (36.8 C) (Oral)   Resp (!) 21   Ht 5\' 6"  (1.676 m)   Wt 122.5 kg   SpO2 100%   BMI 43.58 kg/m   Physical Exam Vitals and nursing note reviewed.  Constitutional:      Appearance: She is well-developed.  HENT:     Head: Normocephalic and atraumatic.     Nose: Nose normal. No congestion or rhinorrhea.     Mouth/Throat:     Mouth: Mucous membranes are moist.  Eyes:     Pupils: Pupils are equal, round, and reactive to light.  Cardiovascular:     Rate and Rhythm: Normal rate and regular rhythm.  Pulmonary:     Effort: No respiratory distress.     Breath sounds: No stridor.  Abdominal:     General: There is no distension.  Musculoskeletal:        General: No swelling or tenderness. Normal range of motion.     Cervical back: Normal range of motion.  Skin:    General: Skin is warm and dry.  Neurological:     General: No focal deficit present.     Mental Status: She is alert.     ED Results / Procedures / Treatments   Labs (all labs ordered are listed, but only abnormal results are displayed) Labs Reviewed  RESPIRATORY PANEL BY RT PCR (FLU A&B, COVID)    EKG EKG Interpretation  Date/Time:  Sunday July 19 2020 29:93:71 EST Ventricular Rate:  79 PR Interval:    QRS Duration: 98 QT Interval:  414 QTC Calculation: 475 R Axis:   9 Text Interpretation: Sinus rhythm No acute changes Confirmed by Merrily Pew [phone]) on 07/19/2020 6:33:26 AM   Radiology DG Chest Portable 1 View  Result Date: 07/19/2020 CLINICAL DATA:  Cough and congestion. EXAM: PORTABLE CHEST 1 VIEW COMPARISON:  05/22/2020 and prior  radiographs FINDINGS: The cardiomediastinal silhouette is unremarkable. There is no evidence of focal airspace disease, pulmonary edema, suspicious pulmonary nodule/mass, pleural effusion,  or pneumothorax. No acute bony abnormalities are identified. IMPRESSION: No active disease. Electronically Signed   By: Margarette Canada M.D.   On: 07/19/2020 07:23    Procedures Procedures (including critical care time)  Medications Ordered in ED Medications  predniSONE (DELTASONE) tablet 60 mg (60 mg Oral Given 07/19/20 0620)  azithromycin (ZITHROMAX) tablet 500 mg (500 mg Oral Given 07/19/20 0620)  albuterol (VENTOLIN HFA) 108 (90 Base) MCG/ACT inhaler 8 puff (8 puffs Inhalation Given 07/19/20 0620)    ED Course  I have reviewed the triage vital signs and the nursing notes.  Pertinent labs & imaging results that were available during my care of the patient were reviewed by me and considered in my medical decision making (see chart for details).    MDM Rules/Calculators/A&P                          Likely bronchitis. Improved with albuterol and prednisone and azithromycin. cxr clear. ecg ok. Doubt cardiac issues. No pneumonia.   Final Clinical Impression(s) / ED Diagnoses Final diagnoses:  Upper respiratory tract infection, unspecified type    Rx / DC Orders ED Discharge Orders         Ordered    predniSONE (DELTASONE) 20 MG tablet        07/19/20 0717    azithromycin (ZITHROMAX) 250 MG tablet  Daily        07/19/20 0717           Deiondre Harrower, Corene Cornea, MD 07/19/20 [phone]

## 2020-07-19 NOTE — ED Triage Notes (Signed)
BIB [facility] from home. Starting on Wednesday pt started to have sinus pressure, cough and congestion. Yesterday pt became short of breath at rest. Had Covid in August.

## 2020-12-27 ENCOUNTER — Emergency Department (HOSPITAL_COMMUNITY)
Admission: EM | Admit: 2020-12-27 | Discharge: 2020-12-27 | Disposition: A | Discharge status: Home / Self Care | Payer: Medicaid Other | Attending: Emergency Medicine | Admitting: Emergency Medicine

## 2020-12-27 ENCOUNTER — Emergency Department (HOSPITAL_COMMUNITY): Payer: Medicaid Other

## 2020-12-27 ENCOUNTER — Other Ambulatory Visit: Payer: Self-pay

## 2020-12-27 ENCOUNTER — Encounter (HOSPITAL_COMMUNITY): Payer: Self-pay | Admitting: Emergency Medicine

## 2020-12-27 ENCOUNTER — Emergency Department (HOSPITAL_BASED_OUTPATIENT_CLINIC_OR_DEPARTMENT_OTHER): Payer: Medicaid Other

## 2020-12-27 DIAGNOSIS — M7989 Other specified soft tissue disorders: Secondary | ICD-10-CM | POA: Diagnosis not present

## 2020-12-27 DIAGNOSIS — J45909 Unspecified asthma, uncomplicated: Secondary | ICD-10-CM | POA: Insufficient documentation

## 2020-12-27 DIAGNOSIS — M79604 Pain in right leg: Secondary | ICD-10-CM

## 2020-12-27 DIAGNOSIS — Y93F2 Activity, caregiving, lifting: Secondary | ICD-10-CM | POA: Insufficient documentation

## 2020-12-27 DIAGNOSIS — X500XXA Overexertion from strenuous movement or load, initial encounter: Secondary | ICD-10-CM | POA: Diagnosis not present

## 2020-12-27 DIAGNOSIS — M25561 Pain in right knee: Secondary | ICD-10-CM | POA: Insufficient documentation

## 2020-12-27 DIAGNOSIS — Y990 Civilian activity done for income or pay: Secondary | ICD-10-CM | POA: Insufficient documentation

## 2020-12-27 DIAGNOSIS — I10 Essential (primary) hypertension: Secondary | ICD-10-CM | POA: Insufficient documentation

## 2020-12-27 DIAGNOSIS — Z7984 Long term (current) use of oral hypoglycemic drugs: Secondary | ICD-10-CM | POA: Diagnosis not present

## 2020-12-27 DIAGNOSIS — E119 Type 2 diabetes mellitus without complications: Secondary | ICD-10-CM | POA: Insufficient documentation

## 2020-12-27 DIAGNOSIS — Z79899 Other long term (current) drug therapy: Secondary | ICD-10-CM | POA: Diagnosis not present

## 2020-12-27 MED ORDER — ACETAMINOPHEN 325 MG PO TABS
650.0000 mg | ORAL_TABLET | Freq: Once | ORAL | Status: AC
  Administered 2020-12-27: 650 mg via ORAL
  Filled 2020-12-27: qty 2

## 2020-12-27 NOTE — Discharge Instructions (Signed)
-  Continue naproxen and Voltaren gel for pain.  Follow-up with orthopedics.  X-ray did not show any broken bones and ultrasound not show any blood clots.

## 2020-12-27 NOTE — Progress Notes (Signed)
Lower extremity venous has been completed.   Preliminary results in CV Proc.   Abram Sander 12/27/2020 1:58 PM

## 2020-12-27 NOTE — ED Notes (Signed)
US at bedside

## 2020-12-27 NOTE — ED Provider Notes (Signed)
[name]   CSN: [id number] Arrival date & time: 12/27/20  1213     History Chief Complaint  Patient presents with  . Knee Pain    [name] is a 45 y.o. female with past medical history significant for anxiety, hypertension, lupus on Plaquenil, type 2 diabetes.   HPI Patient presents to emergency room today with chief complaint of right knee pain x5 days.  Patient states pain started after she was lifting something of a heavy crate at work.  She reports hearing a popping sound.  She saw PCP was prescribed topical cream and naproxen which she does not feel like is helping.  Patient states she has continued to have swelling in her right leg as well.  Typically swelling is better with elevation however this time it is not.  She rates her pain 7 out of 10 in severity.  She has throbbing sensation in her knee that radiates down her leg.  She states she will occasionally have numbness in her pinky toe.  She denies any fever, chills, any recent travel or history of blood clots.   Past Medical History:  Diagnosis Date  . Anal fissure   . Anxiety   . Asthma   . Back pain   . Bronchitis   . Chronic headaches   . Complication of anesthesia    Slow to arouse after anesthesia  . Diabetes mellitus without complication ([place])   . Fibroid   . Hypertension   . Lupus ([place])   . Neck pain   . Obesity   . Ovarian cyst     Patient Active Problem List   Diagnosis Date Noted  . Constipation 04/18/2019  . Low back pain 09/17/2015  . Abnormal laboratory test 08/03/2015  . Fatigue 08/03/2015  . Excessive sleepiness 07/30/2015  . Migraine 07/30/2015  . Memory loss 07/30/2015  . Dysphagia 06/29/2015    Past Surgical History:  Procedure Laterality Date  . APPENDECTOMY    . BREAST LUMPECTOMY     right breast  . DILATION AND CURETTAGE OF UTERUS    . OVARIAN CYST REMOVAL    . WISDOM TOOTH EXTRACTION       OB History    Gravida  3    Para  1   Term  1   Preterm      AB  2   Living  1     SAB  2   IAB      Ectopic      Multiple      Live Births  1           Family History  Problem Relation Age of Onset  . Hypertension Mother   . Diabetes Father   . Hypertension Father   . Stroke Father   . Heart attack Father   . Aneurysm Paternal Aunt   . Schizophrenia Daughter   . Anxiety disorder Daughter   . ADD / ADHD Daughter   . Heart murmur Daughter   . Sleep disorder Daughter   . Colon cancer Neg Hx     Social History   Tobacco Use  . Smoking status: Never Smoker  . Smokeless tobacco: Never Used  Vaping Use  . Vaping Use: Never used  Substance Use Topics  . Alcohol use: No    Comment: occ  . Drug use: No    Home Medications Prior to Admission medications   Medication Sig Start Date End Date Taking? Authorizing  Provider  albuterol (PROVENTIL HFA;VENTOLIN HFA) 108 (90 BASE) MCG/ACT inhaler Inhale 1-2 puffs into the lungs every 6 (six) hours as needed for wheezing or shortness of breath. 05/05/14   Debby Freiberg, MD  amLODipine (NORVASC) 5 MG tablet Take 1 tablet by mouth daily. Has not started yet 12/29/16   [provider]  azithromycin (ZITHROMAX) 250 MG tablet Take 1 tablet (250 mg total) by mouth daily. Take first 2 tablets together, then 1 every day until finished. 07/19/20   Mesner, Corene Cornea, MD  cyclobenzaprine (FLEXERIL) 10 MG tablet Take 1 tablet (10 mg total) by mouth 3 (three) times daily as needed for muscle spasms. 03/15/17   Marcial Pacas, MD  ferrous sulfate ([facility] IRON) 325 (65 FE) MG tablet Take 325 mg by mouth daily.    [provider]  hydrochlorothiazide (HYDRODIURIL) 25 MG tablet Take 25 mg by mouth daily.    [provider]  hydroxychloroquine (PLAQUENIL) 200 MG tablet Take 1 tablet by mouth 2 (two) times daily. 11/17/15   [provider]  lisinopril (ZESTRIL) 10 MG tablet Take 10 mg by mouth daily. 02/15/19   [provider]   metFORMIN (GLUCOPHAGE) 500 MG tablet Take 500 mg by mouth 3 (three) times daily. 05/31/15   [provider]  naproxen (NAPROSYN) 500 MG tablet Take 1 tablet by mouth daily as needed for mild pain.  12/16/15   [provider]  potassium chloride (K-DUR) 10 MEQ tablet Take 20 mEq by mouth daily.    [provider]  predniSONE (DELTASONE) 20 MG tablet 3 tabs po day one, then 2 po daily x 4 days 07/19/20   Mesner, Corene Cornea, MD    Allergies    Ciprofloxacin, Metronidazole, Shellfish-derived products, Toradol [ketorolac tromethamine], Eggs or egg-derived products, Elemental sulfur, Ketorolac, Amoxicillin, and Influenza vaccines  Review of Systems   Review of Systems All other systems are reviewed and are negative for acute change except as noted in the HPI.  Physical Exam Updated Vital Signs BP (!) 177/85 (BP Location: Right Arm)   Pulse 80   Temp 98.5 F (36.9 C) (Oral)   Resp 16   LMP 12/18/2020   SpO2 100%   Physical Exam Vitals and nursing [name] reviewed.  Constitutional:      Appearance: She is well-developed. She is not ill-appearing or toxic-appearing.  HENT:     Head: Normocephalic and atraumatic.     Nose: Nose normal.  Eyes:     General: No scleral icterus.       Right eye: No discharge.        Left eye: No discharge.     Conjunctiva/sclera: Conjunctivae normal.  Neck:     Vascular: No JVD.  Cardiovascular:     Rate and Rhythm: Normal rate and regular rhythm.     Pulses: Normal pulses.     Heart sounds: Normal heart sounds.  Pulmonary:     Effort: Pulmonary effort is normal.     Breath sounds: Normal breath sounds.  Abdominal:     General: There is no distension.  Musculoskeletal:     Cervical back: Normal range of motion.     Comments: Tender to palpation of right patella.  Deformity or crepitus noted.  Tenderness palpation of right calf.  No palpable cords felt.  Bilateral lower extremity swelling.  Skin:    General: Skin is warm and dry.   Neurological:     Mental Status: She is oriented to person, place, and time.  GCS: GCS eye subscore is 4. GCS verbal subscore is 5. GCS motor subscore is 6.     Comments: Fluent speech, no facial droop.  Psychiatric:        Behavior: Behavior normal.     ED Results / Procedures / Treatments   Labs (all labs ordered are listed, but only abnormal results are displayed) Labs Reviewed - No data to display  EKG None  Radiology DG Knee Complete 4 Views Right  Result Date: 12/27/2020 CLINICAL DATA:  Knee pain. EXAM: RIGHT KNEE - COMPLETE 4+ VIEW COMPARISON:  None. FINDINGS: Osseous alignment is normal. Bone mineralization is normal. No fracture line or displaced fracture fragment. No degenerative change. No appreciable joint effusion and adjacent soft tissues are unremarkable. IMPRESSION: Negative. Electronically Signed   By: Franki Cabot M.D.   On: 12/27/2020 13:22   VAS [place] LOWER EXTREMITY VENOUS (DVT) (ONLY [facility] & [facility])  Result Date: 12/27/2020  Lower Venous DVT Study Patient Name:  LUCINDIA LEMLEY  Date of Exam:   12/27/2020 Medical Rec #: [id number]         Accession #:    [id number] Date of Birth: Jun 18, 1976         Patient Gender: F Patient Age:   [place] Exam Location:  [facility] Procedure:      VAS [place] LOWER EXTREMITY VENOUS (DVT) Referring Phys: [id number] Barrie Folk --------------------------------------------------------------------------------  Indications: Pain.  Comparison Study: no prior Performing Technologist: Abram Sander RVS  Examination Guidelines: A complete evaluation includes B-mode imaging, spectral Doppler, color Doppler, and power Doppler as needed of all accessible portions of each vessel. Bilateral testing is considered an integral part of a complete examination. Limited examinations for reoccurring indications may be performed as noted. The reflux portion of the exam is performed with the patient in reverse Trendelenburg.   +---------+---------------+---------+-----------+----------+--------------+ RIGHT    CompressibilityPhasicitySpontaneityPropertiesThrombus Aging +---------+---------------+---------+-----------+----------+--------------+ CFV      Full           Yes      Yes                                 +---------+---------------+---------+-----------+----------+--------------+ SFJ      Full                                                        +---------+---------------+---------+-----------+----------+--------------+ FV Prox  Full                                                        +---------+---------------+---------+-----------+----------+--------------+ FV Mid   Full                                                        +---------+---------------+---------+-----------+----------+--------------+ FV DistalFull                                                        +---------+---------------+---------+-----------+----------+--------------+  PFV      Full                                                        +---------+---------------+---------+-----------+----------+--------------+ POP      Full           Yes      Yes                                 +---------+---------------+---------+-----------+----------+--------------+ PTV      Full                                                        +---------+---------------+---------+-----------+----------+--------------+ PERO     Full                                                        +---------+---------------+---------+-----------+----------+--------------+   +----+---------------+---------+-----------+----------+--------------+ LEFTCompressibilityPhasicitySpontaneityPropertiesThrombus Aging +----+---------------+---------+-----------+----------+--------------+ CFV Full           Yes      Yes                                  +----+---------------+---------+-----------+----------+--------------+     Summary: RIGHT: - There is no evidence of deep vein thrombosis in the lower extremity.  - No cystic structure found in the popliteal fossa.  LEFT: - No evidence of common femoral vein obstruction.  *See table(s) above for measurements and observations.    Preliminary     Procedures Procedures   Medications Ordered in ED Medications  acetaminophen (TYLENOL) tablet 650 mg (650 mg Oral Given 12/27/20 1341)    ED Course  I have reviewed the triage vital signs and the nursing notes.  Pertinent labs & imaging results that were available during my care of the patient were reviewed by me and considered in my medical decision making (see chart for details).    MDM Rules/Calculators/A&P                          History provided by patient with additional history obtained from chart review.    Patient presents to the ED with complaints of pain to the right knee s/p injury heavy lifting.  Tenderness palpation of right patella.  Tender palpation of calf.  Neurovascularly intact distally.  X-ray of right knee shows no fracture or dislocation.  Given tenderness ultrasound performed and DVT study is negative. PRICE and motrin recommended. I discussed results, treatment plan, need for follow-up, and return precautions with the patient.  Recommend patient follow-up with [facility] where she is already established patient.  Provided opportunity for questions, patient confirmed understanding and are in agreement with plan.    Portions of this [name] were generated with [employer]. Dictation errors may occur despite best attempts at proofreading.    Final Clinical Impression(s) / ED Diagnoses Final  diagnoses:  Acute pain of right knee    Rx / DC Orders ED Discharge Orders    None       Lewanda Rife 12/27/20 1458    Lacretia Leigh, MD 12/28/20 [phone]

## 2020-12-27 NOTE — ED Triage Notes (Signed)
Patient c/o knee pain which started on Wednesday after lifting something off a crate at work. She reports her knee made a "popping" sound. She states she saw her PCP and was prescribed topical cream and naproxen. She reports symptoms are still persisting and she had to leave work today d/t pain.

## 2021-04-03 ENCOUNTER — Ambulatory Visit (INDEPENDENT_AMBULATORY_CARE_PROVIDER_SITE_OTHER): Payer: Medicaid Other

## 2021-04-03 ENCOUNTER — Other Ambulatory Visit: Payer: Self-pay

## 2021-04-03 ENCOUNTER — Encounter (HOSPITAL_COMMUNITY): Payer: Self-pay

## 2021-04-03 ENCOUNTER — Ambulatory Visit (HOSPITAL_COMMUNITY)
Admission: EM | Admit: 2021-04-03 | Discharge: 2021-04-03 | Disposition: A | Discharge status: Home / Self Care | Payer: Medicaid Other | Attending: Emergency Medicine | Admitting: Emergency Medicine

## 2021-04-03 DIAGNOSIS — M25561 Pain in right knee: Secondary | ICD-10-CM | POA: Diagnosis not present

## 2021-04-03 DIAGNOSIS — M545 Low back pain, unspecified: Secondary | ICD-10-CM

## 2021-04-03 DIAGNOSIS — M79604 Pain in right leg: Secondary | ICD-10-CM

## 2021-04-03 DIAGNOSIS — M5441 Lumbago with sciatica, right side: Secondary | ICD-10-CM

## 2021-04-03 DIAGNOSIS — M25551 Pain in right hip: Secondary | ICD-10-CM

## 2021-04-03 DIAGNOSIS — G8929 Other chronic pain: Secondary | ICD-10-CM | POA: Diagnosis not present

## 2021-04-03 NOTE — ED Triage Notes (Signed)
The right leg, knee swells up sometimes, sometimes its warm, cannot bend sometimes

## 2021-04-03 NOTE — ED Provider Notes (Signed)
[place]    CSN: [id number] Arrival date & time: 04/03/21  1006      History   Chief Complaint Chief Complaint  Patient presents with   Leg Pain    HPI [name] is a 45 y.o. female.   HPI [name] is a 45 y.o. female presenting to UC with c/o acute exacerbation of her chronic Right knee pain. Pain is 6/10, aching and sharp at times, worse with ambulation.   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  She is currently going to PT twice a week as recommended by her orthopedist but came today due to intermittent tingling in her toes in her Right foot. No recent injuries. No fever, abdominal pain, bowel or bladder symptoms or changes.  No saddle paresthesia.  She has been told she likely has a torn meniscus.  She has had steroid treatments in the past and has an extra pack at home to take for bad flares.  She was offered a knee injection by orthopedist but wants to try PT first.  Pt states orthopedist has not examined her back or hips yet but has had some pain in both over the last week, aching and sore.    Past Medical History:  Diagnosis Date   Anal fissure    Anxiety    Asthma    Back pain    Bronchitis    Chronic headaches    Complication of anesthesia    Slow to arouse after anesthesia   Diabetes mellitus without complication ([place])    Fibroid    Hypertension    Lupus ([place])    Neck pain    Obesity    Ovarian cyst     Patient Active Problem List   Diagnosis Date Noted   Constipation 04/18/2019   Low back pain 09/17/2015   Abnormal laboratory test 08/03/2015   Fatigue 08/03/2015   Excessive sleepiness 07/30/2015   Migraine 07/30/2015   Memory loss 07/30/2015   Dysphagia 06/29/2015    Past Surgical History:  Procedure Laterality Date   APPENDECTOMY     BREAST LUMPECTOMY     right breast   DILATION AND CURETTAGE OF UTERUS     OVARIAN CYST REMOVAL     WISDOM TOOTH EXTRACTION      OB History     Gravida  3    Para  1   Term  1   Preterm      AB  2   Living  1      SAB  2   IAB      Ectopic      Multiple      Live Births  1            Home Medications    Prior to Admission medications   Medication Sig Start Date End Date Taking? Authorizing Provider  amLODipine (NORVASC) 5 MG tablet Take 1 tablet by mouth daily. Has not started yet 12/29/16  Yes [provider]  ferrous sulfate 325 (65 FE) MG tablet Take 325 mg by mouth daily.   Yes [provider]  hydrochlorothiazide (HYDRODIURIL) 25 MG tablet Take 25 mg by mouth daily.   Yes [provider]  hydroxychloroquine (PLAQUENIL) 200 MG tablet Take 1 tablet by mouth 2 (two) times daily. 11/17/15  Yes [provider]  lisinopril (ZESTRIL) 10 MG tablet Take 10 mg by mouth daily. 02/15/19  Yes [provider]  metFORMIN (GLUCOPHAGE) 500 MG  tablet Take 500 mg by mouth 3 (three) times daily. 05/31/15  Yes [provider]  potassium chloride (K-DUR) 10 MEQ tablet Take 20 mEq by mouth daily.   Yes [provider]  albuterol (PROVENTIL HFA;VENTOLIN HFA) 108 (90 BASE) MCG/ACT inhaler Inhale 1-2 puffs into the lungs every 6 (six) hours as needed for wheezing or shortness of breath. 05/05/14   Debby Freiberg, MD  azithromycin (ZITHROMAX) 250 MG tablet Take 1 tablet (250 mg total) by mouth daily. Take first 2 tablets together, then 1 every day until finished. 07/19/20   Mesner, Corene Cornea, MD  cyclobenzaprine (FLEXERIL) 10 MG tablet Take 1 tablet (10 mg total) by mouth 3 (three) times daily as needed for muscle spasms. 03/15/17   Marcial Pacas, MD  naproxen (NAPROSYN) 500 MG tablet Take 1 tablet by mouth daily as needed for mild pain.  12/16/15   [provider]  predniSONE (DELTASONE) 20 MG tablet 3 tabs po day one, then 2 po daily x 4 days 07/19/20   Mesner, Corene Cornea, MD    Family History Family History  Problem Relation Age of Onset   Hypertension Mother    Diabetes Father     Hypertension Father    Stroke Father    Heart attack Father    Aneurysm Paternal Aunt    Schizophrenia Daughter    Anxiety disorder Daughter    ADD / ADHD Daughter    Heart murmur Daughter    Sleep disorder Daughter    Colon cancer Neg Hx     Social History Social History   Tobacco Use   Smoking status: Never   Smokeless tobacco: Never  Vaping Use   Vaping Use: Never used  Substance Use Topics   Alcohol use: No    Comment: occ   Drug use: No     Allergies   Ciprofloxacin, Metronidazole, Shellfish-derived products, Toradol [ketorolac tromethamine], Eggs or egg-derived products, Elemental sulfur, Ketorolac, Amoxicillin, and Influenza vaccines   Review of Systems Review of Systems  Constitutional:  Negative for chills and fever.  Genitourinary:  Negative for decreased urine volume, dysuria, flank pain, frequency, hematuria and urgency.  Musculoskeletal:  Positive for arthralgias, back pain, joint swelling and myalgias. Negative for gait problem, neck pain and neck stiffness.  Skin:  Negative for color change and wound.    Physical Exam Triage Vital Signs ED Triage Vitals  Enc Vitals Group     BP 04/03/21 1058 123/81     Pulse Rate 04/03/21 1058 74     Resp 04/03/21 1058 18     Temp --      Temp Source 04/03/21 1058 Oral     SpO2 04/03/21 1058 100 %     Weight --      Height --      Head Circumference --      Peak Flow --      Pain Score 04/03/21 1057 6     Pain Loc --      Pain Edu? --      Excl. in [place]? --    No data found.  Updated Vital Signs BP 123/81 (BP Location: Right Arm)   Pulse 74   Resp 18   LMP 03/01/2021   SpO2 100%   Visual Acuity Right Eye Distance:   Left Eye Distance:   Bilateral Distance:    Right Eye Near:   Left Eye Near:    Bilateral Near:     Physical Exam Vitals and nursing note reviewed.  Constitutional:      General: She is not in acute distress.    Appearance: Normal appearance. She is well-developed. She is obese.  She is not ill-appearing, toxic-appearing or diaphoretic.  HENT:     Head: Normocephalic and atraumatic.  Cardiovascular:     Rate and Rhythm: Normal rate.  Pulmonary:     Effort: Pulmonary effort is normal.  Musculoskeletal:        General: Swelling and tenderness present. Normal range of motion.     Cervical back: Normal range of motion.     Comments: Lumbar spine tenderness without step-offs or crepitus.  Right hip tenderness full ROM without crepitus. Right knee: tenderness to medial joint space an medial aspect. Full ROM without crepitus. Calf is soft, non-tender. Full ROM ankle and toes.   Skin:    General: Skin is warm and dry.  Neurological:     Mental Status: She is alert and oriented to person, place, and time.     Motor: No weakness.  Psychiatric:        Behavior: Behavior normal.     UC Treatments / Results  Labs (all labs ordered are listed, but only abnormal results are displayed) Labs Reviewed - No data to display  EKG   Radiology DG Lumbar Spine Complete  Result Date: 04/03/2021 CLINICAL DATA:  44 year old female with low back pain and chronic RIGHT leg pain. EXAM: LUMBAR SPINE - COMPLETE 4+ VIEW COMPARISON:  04/13/2015 radiograph and prior studies FINDINGS: There is no evidence of lumbar spine fracture. Alignment is normal. Intervertebral disc spaces are maintained. IMPRESSION: Negative. Electronically Signed   By: Margarette Canada M.D.   On: 04/03/2021 11:42   DG Hip Unilat W or Wo Pelvis 2-3 Views Right  Result Date: 04/03/2021 CLINICAL DATA:  Chronic RIGHT leg and hip pain. EXAM: DG HIP (WITH OR WITHOUT PELVIS) 2-3V RIGHT COMPARISON:  None. FINDINGS: There is no evidence of hip fracture or dislocation. There is no evidence of arthropathy or other focal bone abnormality. IMPRESSION: Negative. Electronically Signed   By: Margarette Canada M.D.   On: 04/03/2021 11:43    Procedures Procedures (including critical care time)  Medications Ordered in UC Medications - No  data to display  Initial Impression / Assessment and Plan / UC Course  I have reviewed the triage vital signs and the nursing notes.  Pertinent labs & imaging results that were available during my care of the patient were reviewed by me and considered in my medical decision making (see chart for details).     Pt noted to have lumbar and Right hip tenderness on exam. Reports of subjective intermittent tingling in toes in Right foot, otherwise normal neuro exam. Discussed normal imaging with pt. No evidence of emergent process at this time, specially cauda equina, spinal abscess, or DVT.  Pt could benefit from knee brace, none available in her size. Declined ace wrap. Plans to look at medical supply store.  Encouraged f/u with her current treatment team including PCP, ortho, and PT about worsening symptoms.  Pt understanding and agreeable with tx plan. Discussed sign/symptoms that require emergent evaluation.  AVS provided  Final Clinical Impressions(s) / UC Diagnoses   Final diagnoses:  Acute right-sided low back pain with right-sided sciatica  Right hip pain  Chronic pain of right knee     Discharge Instructions            ED Prescriptions   None    PDMP not reviewed this encounter.  Noe Gens, [place] 04/03/21 1326

## 2021-06-24 ENCOUNTER — Ambulatory Visit: Payer: Medicaid Other | Admitting: Obstetrics and Gynecology

## 2021-07-02 ENCOUNTER — Encounter: Payer: Self-pay | Admitting: Radiology

## 2021-07-02 ENCOUNTER — Telehealth: Payer: Self-pay | Admitting: Radiology

## 2021-07-02 NOTE — Telephone Encounter (Signed)
Left message for patient to call [facility] to scheduled appointment from referral for [place] for fibroids.

## 2021-09-26 ENCOUNTER — Ambulatory Visit (HOSPITAL_COMMUNITY): Payer: Self-pay

## 2022-01-31 ENCOUNTER — Telehealth: Payer: Self-pay

## 2022-01-31 NOTE — Telephone Encounter (Signed)
Left message for pt to call the office back about sooner appt.

## 2022-02-02 ENCOUNTER — Ambulatory Visit (INDEPENDENT_AMBULATORY_CARE_PROVIDER_SITE_OTHER): Payer: Medicaid Other | Admitting: Family Medicine

## 2022-02-02 ENCOUNTER — Encounter: Payer: Self-pay | Admitting: Family Medicine

## 2022-02-02 ENCOUNTER — Other Ambulatory Visit (HOSPITAL_COMMUNITY)
Admission: RE | Admit: 2022-02-02 | Discharge: 2022-02-02 | Disposition: A | Discharge status: Home / Self Care | Payer: Medicaid Other | Source: Ambulatory Visit | Attending: Family Medicine | Admitting: Family Medicine

## 2022-02-02 VITALS — BP 127/84 | HR 73 | Ht 66.0 in | Wt 279.0 lb

## 2022-02-02 DIAGNOSIS — J984 Other disorders of lung: Secondary | ICD-10-CM | POA: Insufficient documentation

## 2022-02-02 DIAGNOSIS — N898 Other specified noninflammatory disorders of vagina: Secondary | ICD-10-CM | POA: Insufficient documentation

## 2022-02-02 DIAGNOSIS — Z113 Encounter for screening for infections with a predominantly sexual mode of transmission: Secondary | ICD-10-CM | POA: Diagnosis not present

## 2022-02-02 DIAGNOSIS — D259 Leiomyoma of uterus, unspecified: Secondary | ICD-10-CM

## 2022-02-02 DIAGNOSIS — E669 Obesity, unspecified: Secondary | ICD-10-CM | POA: Insufficient documentation

## 2022-02-02 DIAGNOSIS — Z124 Encounter for screening for malignant neoplasm of cervix: Secondary | ICD-10-CM | POA: Insufficient documentation

## 2022-02-02 NOTE — Assessment & Plan Note (Signed)
Small and unchanged and not causing pain or bleeding, will monitor for increasing size.

## 2022-02-02 NOTE — Progress Notes (Signed)
New GYN referral to discuss fibroids   Last Pap: 2015 Last Mammogram : 2022 at [facility] due now per pt. Family Hx of Breast Cancer:None   Pt would like STD screening. Swab only Also wants testing for BV and yeast.  Noted bleeding after intercourse and pain.  CC: Irregular bleeding maybe last month or month before. Bleeding was light lasted 1 wk notes pain. Pt concerned if she may be experiencing Pre-Menopause.

## 2022-02-02 NOTE — Progress Notes (Addendum)
    Subjective:    Patient ID: [name] is a 46 y.o. female presenting with No chief complaint on file.  on 02/02/2022  HPI: Desires STD screen and testing for BV and yeast. Haivng vaginal odor and thick discharge. Used yeast medicine and it hurt. Needs pap smear. Reports irregular cycles. Cycles were regular. Became more irregular after COVID (2021). Still having irregular cycles. Skipping a few months. Having night seats which is new. Cycles are not heavy. Needs f/u for fibroids--seen by Dr. Deatra Ina and found to have fibroids said they were really small and not something to worry.   Review of Systems  Constitutional:  Negative for chills and fever.  Respiratory:  Negative for shortness of breath.   Cardiovascular:  Negative for chest pain.  Gastrointestinal:  Negative for abdominal pain, nausea and vomiting.  Genitourinary:  Negative for dysuria.  Skin:  Negative for rash.     Objective:    BP 127/84   Pulse 73   Ht '5\' 6"'$  (1.676 m)   Wt 279 lb (126.6 kg)   LMP  (LMP Unknown) Comment: irregular bleeding  BMI 45.03 kg/m  Physical Exam Exam conducted with a chaperone present.  Constitutional:      General: She is not in acute distress.    Appearance: She is well-developed.  HENT:     Head: Normocephalic and atraumatic.  Eyes:     General: No scleral icterus. Cardiovascular:     Rate and Rhythm: Normal rate.  Pulmonary:     Effort: Pulmonary effort is normal.  Abdominal:     Palpations: Abdomen is soft.  Genitourinary:    General: Normal vulva.     Vagina: Normal.     Cervix: No lesion.     Uterus: Enlarged (10 weeks size, firm). Not tender.      Adnexa:        Right: No mass or tenderness.         Left: No mass or tenderness.    Musculoskeletal:     Cervical back: Neck supple.  Skin:    General: Skin is warm and dry.  Neurological:     Mental Status: She is alert and oriented to person, place, and time.        Assessment & Plan:   Problem List Items  Addressed This Visit       Unprioritized   Uterine leiomyoma    Small and unchanged and not causing pain or bleeding, will monitor for increasing size.       Other Visit Diagnoses     Screen for STD (sexually transmitted disease)    -  Primary   Relevant Orders   Cytology - PAP   Screening for cervical cancer       Relevant Orders   Cytology - PAP   Vaginal odor       Check wetprep and treat appropriately.   Relevant Orders   Cervicovaginal ancillary only( [facility])      Has BV--rx for Cleocin cream sent in--allergic to flagyl  Return if symptoms worsen or fail to improve.  Donnamae Jude, MD 02/02/2022 2:22 PM

## 2022-02-04 LAB — CERVICOVAGINAL ANCILLARY ONLY
Bacterial Vaginitis (gardnerella): POSITIVE — AB
Candida Glabrata: NEGATIVE
Candida Vaginitis: NEGATIVE
Comment: NEGATIVE
Comment: NEGATIVE
Comment: NEGATIVE
Comment: NEGATIVE
Trichomonas: NEGATIVE

## 2022-02-06 MED ORDER — CLINDAMYCIN PHOSPHATE 2 % VA CREA: 1.0000 | TOPICAL_CREAM | Freq: Every day | VAGINAL | 0 refills | Status: DC

## 2022-02-06 NOTE — Addendum Note (Signed)
Addended by: Donnamae Jude on: 02/06/2022 12:29 PM   Modules accepted: Orders

## 2022-02-07 LAB — CYTOLOGY - PAP
Chlamydia: NEGATIVE
Comment: NEGATIVE
Comment: NEGATIVE
Comment: NORMAL
Diagnosis: NEGATIVE
High risk HPV: NEGATIVE
Neisseria Gonorrhea: NEGATIVE

## 2022-03-09 ENCOUNTER — Encounter: Payer: Medicaid Other | Admitting: Family Medicine

## 2022-08-26 ENCOUNTER — Emergency Department (HOSPITAL_COMMUNITY)
Admission: EM | Admit: 2022-08-26 | Discharge: 2022-08-26 | Discharge status: Against Medical Advice | Payer: Medicaid Other | Attending: Emergency Medicine | Admitting: Emergency Medicine

## 2022-08-26 ENCOUNTER — Other Ambulatory Visit: Payer: Self-pay

## 2022-08-26 ENCOUNTER — Encounter (HOSPITAL_COMMUNITY): Payer: Self-pay

## 2022-08-26 DIAGNOSIS — R112 Nausea with vomiting, unspecified: Secondary | ICD-10-CM | POA: Insufficient documentation

## 2022-08-26 DIAGNOSIS — Z5321 Procedure and treatment not carried out due to patient leaving prior to being seen by health care provider: Secondary | ICD-10-CM | POA: Insufficient documentation

## 2022-08-26 DIAGNOSIS — T5991XA Toxic effect of unspecified gases, fumes and vapors, accidental (unintentional), initial encounter: Secondary | ICD-10-CM | POA: Diagnosis not present
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
## 2022-10-30 IMAGING — DX DG LUMBAR SPINE COMPLETE 4+V
4 series · 4 of 4 positions shown · non-contrast
Comparison: 04/13/2015 radiograph and prior studies

CLINICAL DATA: 45-year-old female with low back pain and chronic
RIGHT leg pain.

EXAM:
LUMBAR SPINE - COMPLETE 4+ VIEW

[l-spine ap]
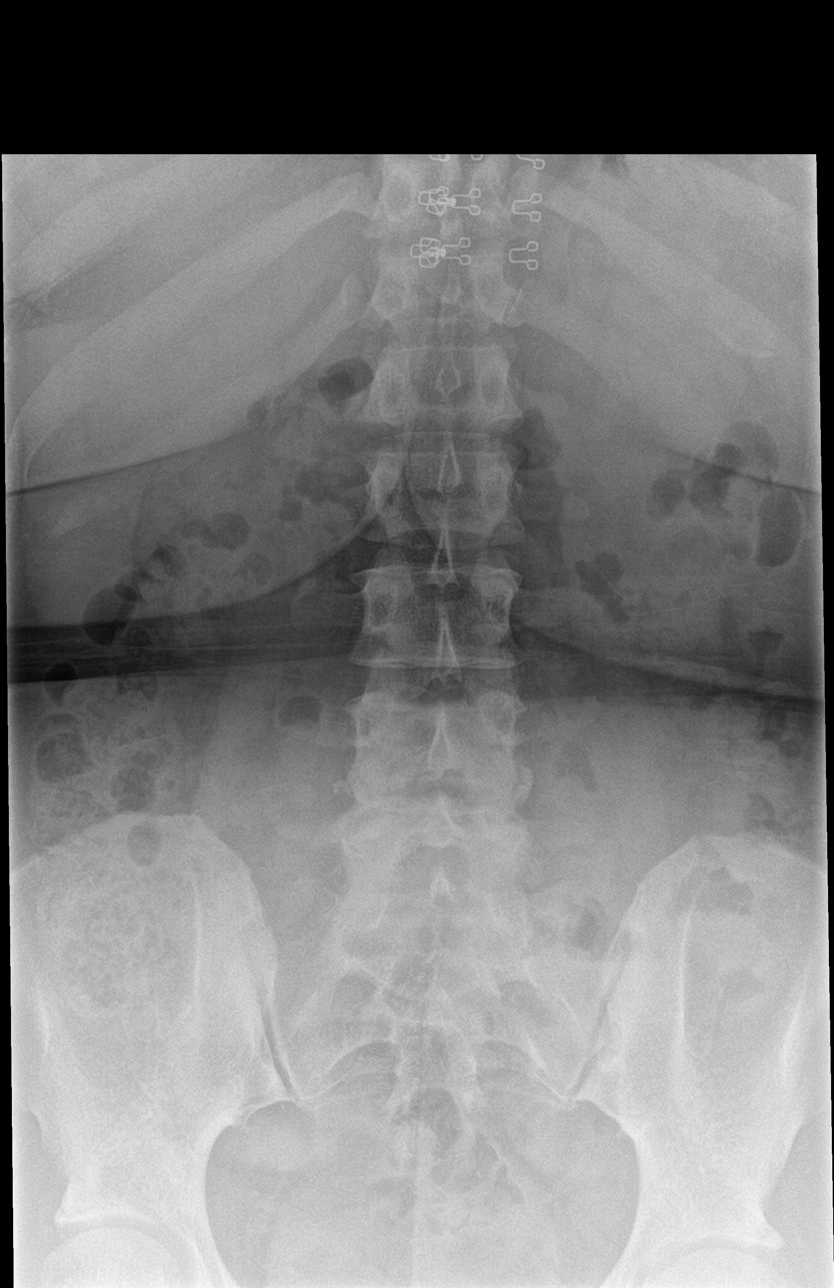

[l-spine obl (1 of 2)]
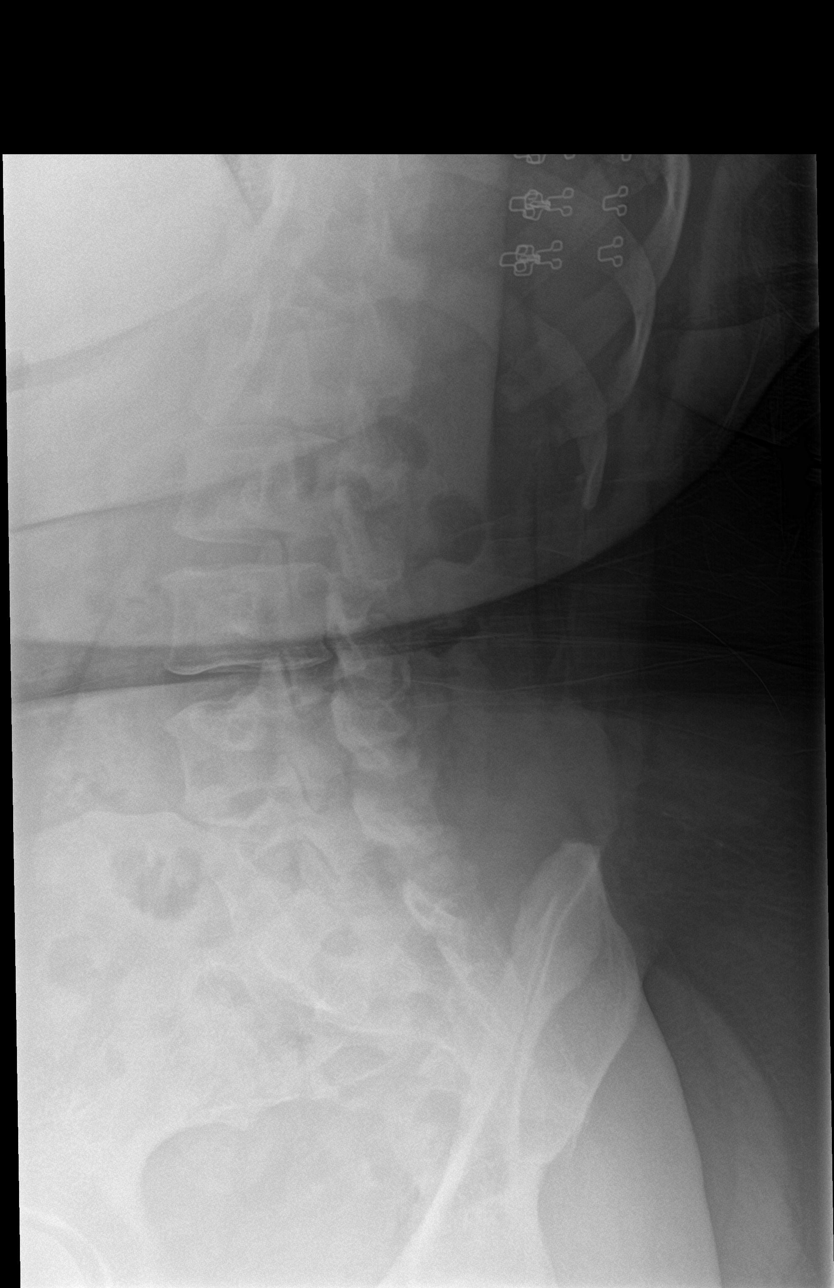

[l-spine obl (2 of 2)]
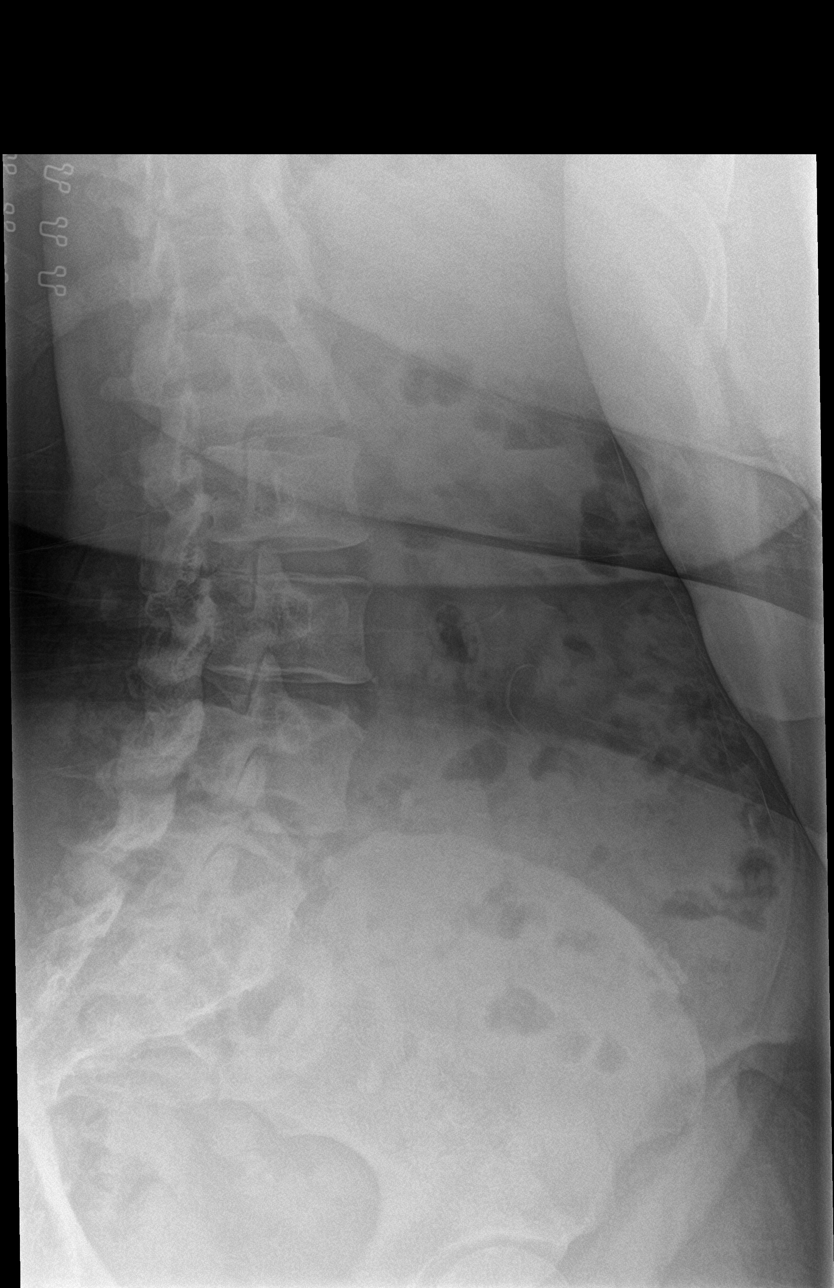

[l-spine lat]
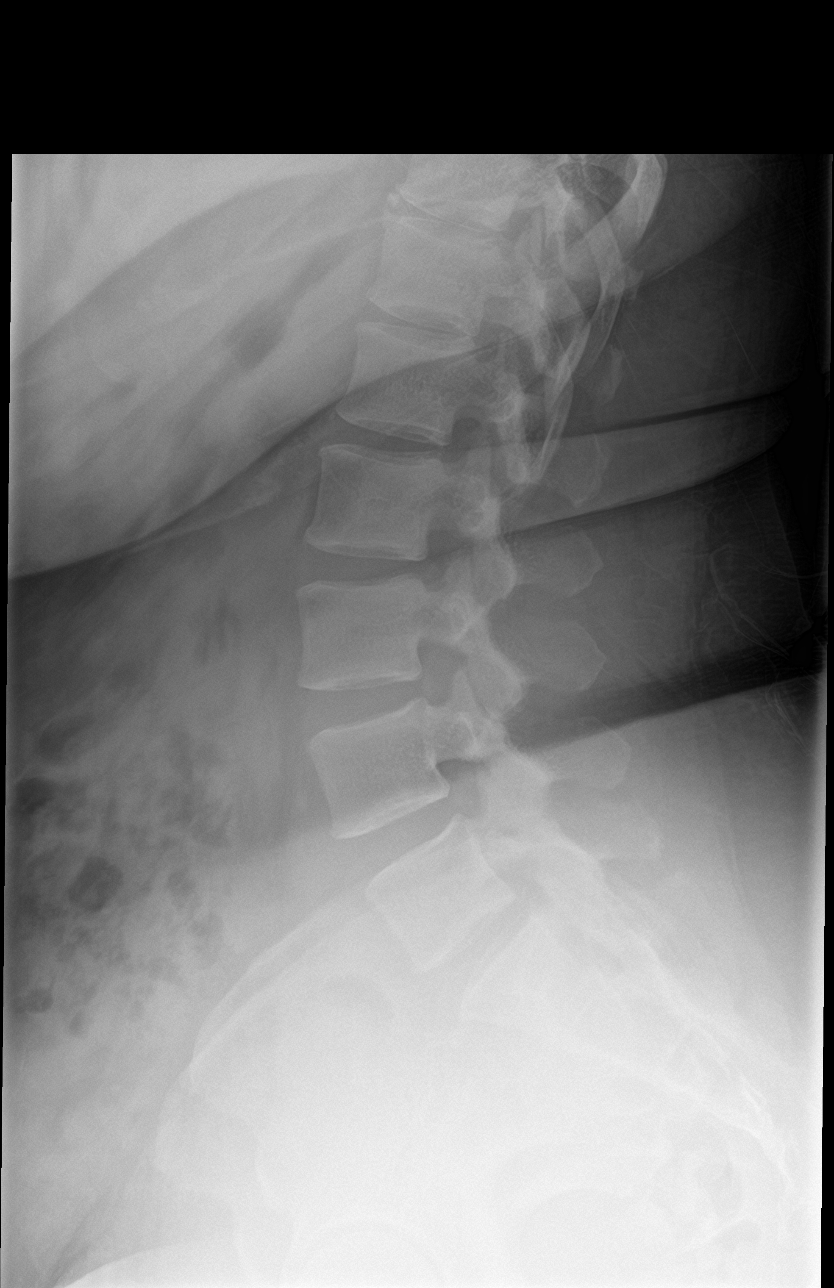

[4 of 4 positions shown; findings below may reference images not displayed]

FINDINGS: There is no evidence of lumbar spine fracture. Alignment is normal.
Intervertebral disc spaces are maintained.
IMPRESSION: Negative.

## 2023-03-08 ENCOUNTER — Ambulatory Visit (INDEPENDENT_AMBULATORY_CARE_PROVIDER_SITE_OTHER): Payer: Medicaid Other | Admitting: Family Medicine

## 2023-03-08 ENCOUNTER — Other Ambulatory Visit (HOSPITAL_COMMUNITY): Admission: RE | Admit: 2023-03-08 | Payer: Medicaid Other | Source: Ambulatory Visit

## 2023-03-08 ENCOUNTER — Encounter: Payer: Self-pay | Admitting: Family Medicine

## 2023-03-08 VITALS — BP 147/81 | HR 87 | Wt 298.2 lb

## 2023-03-08 DIAGNOSIS — I10 Essential (primary) hypertension: Secondary | ICD-10-CM | POA: Diagnosis not present

## 2023-03-08 DIAGNOSIS — D259 Leiomyoma of uterus, unspecified: Secondary | ICD-10-CM

## 2023-03-08 DIAGNOSIS — N921 Excessive and frequent menstruation with irregular cycle: Secondary | ICD-10-CM | POA: Diagnosis not present

## 2023-03-08 DIAGNOSIS — Z01419 Encounter for gynecological examination (general) (routine) without abnormal findings: Secondary | ICD-10-CM | POA: Diagnosis not present

## 2023-03-08 DIAGNOSIS — N914 Secondary oligomenorrhea: Secondary | ICD-10-CM | POA: Diagnosis not present

## 2023-03-08 DIAGNOSIS — E041 Nontoxic single thyroid nodule: Secondary | ICD-10-CM

## 2023-03-08 NOTE — Progress Notes (Signed)
Subjective:     [name] is a 47 y.o. female and is here for a comprehensive physical exam. The patient reports problems - irregular cycles and some are heavy. Reports h/o fibroids .Has occasional night sweats.  The following portions of the patient's history were reviewed and updated as appropriate: allergies, current medications, past family history, past medical history, past social history, past surgical history, and problem list.  Review of Systems Pertinent items noted in HPI and remainder of comprehensive ROS otherwise negative.   Objective:  Chaperone present for exam   BP (!) 147/81   Pulse 87   Wt 298 lb 3.2 oz (135.3 kg)   BMI 48.13 kg/m  General appearance: alert, cooperative, appears stated age, and moderately obese Head: Normocephalic, without obvious abnormality, atraumatic Neck: no adenopathy, supple, symmetrical, trachea midline, and thyroid  nodule on right side Lungs: clear to auscultation bilaterally Heart: regular rate and rhythm, S1, S2 normal, no murmur, click, rub or gallop Abdomen: soft, non-tender; bowel sounds normal; no masses,  no organomegaly Pelvic: cervix normal in appearance, external genitalia normal, no adnexal masses or tenderness, no cervical motion tenderness, uterus normal size, shape, and consistency, vagina normal without discharge, and exam limited by body habitus Extremities: extremities normal, atraumatic, no cyanosis or edema Pulses: 2+ and symmetric Skin: Skin color, texture, turgor normal. No rashes or lesions Lymph nodes: Cervical, supraclavicular, and axillary nodes normal. Neurologic: Grossly normal    Assessment:    GYN female exam.      Plan:   Problem List Items Addressed This Visit       Unprioritized   Hypertension    99214 -  to see PCP tomorrow      Uterine leiomyoma    16109 - Per report--will check u/s. Now having bleeding so need to ensure this is not worsening.      Relevant Orders   US PELVIC  COMPLETE WITH TRANSVAGINAL   Thyroid nodule    99214 - have PCP f/u on this with imaging--checking TSH now.      Other Visit Diagnoses     Encounter for gynecological examination without abnormal finding    -  Primary   Mammogram is done with [facility] Every[facility]. To ask about need for colonoscopy given age.   Relevant Orders   Cervicovaginal ancillary only   Secondary oligomenorrhea       99214 - Check TSH, FSH   Relevant Orders   TSH   Follicle stimulating hormone   Menorrhagia with irregular cycle       99214 - check TSH, pelvic sonogram   Relevant Orders   US PELVIC COMPLETE WITH TRANSVAGINAL      Return in 1 year (on 03/07/2024).    See After Visit Summary for Counseling Recommendations

## 2023-03-08 NOTE — Assessment & Plan Note (Addendum)
16109 - Per report--will check u/s. Now having bleeding so need to ensure this is not worsening.

## 2023-03-08 NOTE — Assessment & Plan Note (Addendum)
16109 -  to see PCP tomorrow

## 2023-03-08 NOTE — Assessment & Plan Note (Signed)
70623 - have PCP f/u on this with imaging--checking TSH now.

## 2023-03-08 NOTE — Progress Notes (Signed)
CC: Pt stating that she has uterine fibroids, and wants to discuss Menopause   Periods are becoming irregular  Pt stating that she is also becoming forgetful     BP- Pt stating that she has not taken her medication today    Vaginal odor x 1-2 months - fragrance free body wash

## 2023-03-08 NOTE — Patient Instructions (Signed)

## 2023-03-09 LAB — FOLLICLE STIMULATING HORMONE: FSH: 66.1 m[IU]/mL

## 2023-03-09 LAB — CERVICOVAGINAL ANCILLARY ONLY
Bacterial Vaginitis (gardnerella): NEGATIVE
Candida Glabrata: NEGATIVE
Candida Vaginitis: NEGATIVE
Comment: NEGATIVE
Comment: NEGATIVE
Comment: NEGATIVE

## 2023-03-09 LAB — TSH: TSH: 1.84 u[IU]/mL (ref 0.450–4.500)

## 2023-03-15 ENCOUNTER — Ambulatory Visit (HOSPITAL_BASED_OUTPATIENT_CLINIC_OR_DEPARTMENT_OTHER)
Admission: RE | Admit: 2023-03-15 | Discharge: 2023-03-15 | Disposition: A | Discharge status: Home / Self Care | Payer: Medicaid Other | Source: Ambulatory Visit | Attending: Family Medicine | Admitting: Family Medicine

## 2023-03-15 DIAGNOSIS — D259 Leiomyoma of uterus, unspecified: Secondary | ICD-10-CM

## 2023-03-15 DIAGNOSIS — N921 Excessive and frequent menstruation with irregular cycle: Secondary | ICD-10-CM | POA: Diagnosis present

## 2024-04-04 ENCOUNTER — Ambulatory Visit: Payer: Self-pay | Admitting: Family Medicine

## 2024-05-07 ENCOUNTER — Encounter: Payer: Self-pay | Admitting: Family Medicine

## 2024-05-07 ENCOUNTER — Other Ambulatory Visit (HOSPITAL_COMMUNITY)
Admission: RE | Admit: 2024-05-07 | Discharge: 2024-05-07 | Disposition: A | Discharge status: Home / Self Care | Source: Ambulatory Visit | Attending: Family Medicine | Admitting: Family Medicine

## 2024-05-07 ENCOUNTER — Ambulatory Visit (INDEPENDENT_AMBULATORY_CARE_PROVIDER_SITE_OTHER): Admitting: Family Medicine

## 2024-05-07 VITALS — BP 152/94 | HR 68 | Wt 304.0 lb

## 2024-05-07 DIAGNOSIS — N898 Other specified noninflammatory disorders of vagina: Secondary | ICD-10-CM

## 2024-05-07 DIAGNOSIS — Z01411 Encounter for gynecological examination (general) (routine) with abnormal findings: Secondary | ICD-10-CM | POA: Diagnosis not present

## 2024-05-07 DIAGNOSIS — R232 Flushing: Secondary | ICD-10-CM | POA: Diagnosis not present

## 2024-05-07 DIAGNOSIS — Z124 Encounter for screening for malignant neoplasm of cervix: Secondary | ICD-10-CM

## 2024-05-07 NOTE — Progress Notes (Signed)
 Patient presents for Annual.  LMP: No cycle in 1 yr. Last pap: Date: 02/02/2022 Contraception: None Mammogram: [facility] 03/2024  STD Screening: Yes Flu Vaccine : Declines  CC: Premenopausal sx's notes  hot flashes will get hot/cold.no period x 1 yr, pain w/ intercourse Notes fibroids.also has a vaginal odor   *Was not able to take B/P Rx today due to having to take linzess  on empty stomach.

## 2024-05-07 NOTE — Progress Notes (Signed)
 Subjective:     [name] is a 48 y.o. female and is here for a comprehensive physical exam. The patient reports problems - menopause.No cycle x 1 year. Hot flashes. Has about 3x/day. Has night sweats. Not sleeping well. Reports vaginal dryness.  The following portions of the patient's history were reviewed and updated as appropriate: allergies, current medications, past family history, past medical history, past social history, past surgical history, and problem list.  Review of Systems Pertinent items noted in HPI and remainder of comprehensive ROS otherwise negative.   Objective:  Chaperone present for exam   BP (!) 152/94   Pulse 68   Wt (!) 304 lb (137.9 kg)   BMI 49.07 kg/m  General appearance: alert, cooperative, appears stated age, and moderately obese Head: Normocephalic, without obvious abnormality, atraumatic Neck: no adenopathy, supple, symmetrical, trachea midline, and thyroid not enlarged, symmetric, no tenderness/mass/nodules Lungs: clear to auscultation bilaterally Breasts: normal appearance, no masses or tenderness Heart: regular rate and rhythm, S1, S2 normal, no murmur, click, rub or gallop Abdomen: soft, non-tender; bowel sounds normal; no masses,  no organomegaly Pelvic: cervix normal in appearance, external genitalia normal, no adnexal masses or tenderness, no cervical motion tenderness, uterus normal size, shape, and consistency, and vagina normal without discharge Extremities: extremities normal, atraumatic, no cyanosis or edema Pulses: 2+ and symmetric Skin: Skin color, texture, turgor normal. No rashes or lesions Lymph nodes: Cervical, supraclavicular, and axillary nodes normal. Neurologic: Grossly normal    Assessment:    Healthy female exam.      Plan:  Encounter for gynecological examination with abnormal finding - Declined flu, mammogram up to date.  Screening for malignant neoplasm of cervix - Plan: Cytology - PAP  Vaginal odor - [id number] -  check wet prep and treat accordingly - Plan: Cervicovaginal ancillary only( [facility])  Hot flashes - [phone] - offered HRT, Veozah, SSRI, Clonidine as options. After careful consideration, declined all. Lifestyle interventions. - Plan: Follicle stimulating hormone    See After Visit Summary for Counseling Recommendations

## 2024-05-08 ENCOUNTER — Ambulatory Visit: Payer: Self-pay | Admitting: Family Medicine

## 2024-05-08 LAB — CERVICOVAGINAL ANCILLARY ONLY
Bacterial Vaginitis (gardnerella): NEGATIVE
Candida Glabrata: NEGATIVE
Candida Vaginitis: NEGATIVE
Chlamydia: NEGATIVE
Comment: NEGATIVE
Comment: NEGATIVE
Comment: NEGATIVE
Comment: NEGATIVE
Comment: NEGATIVE
Comment: NORMAL
Neisseria Gonorrhea: NEGATIVE
Trichomonas: NEGATIVE

## 2024-05-08 LAB — FOLLICLE STIMULATING HORMONE: FSH: 73.1 m[IU]/mL

## 2024-05-08 LAB — CYTOLOGY - PAP
Comment: NEGATIVE
Diagnosis: NEGATIVE
High risk HPV: NEGATIVE
